# Patient Record
Sex: Male | Born: 1975 | Race: Black or African American | Hispanic: No | Marital: Single | Smoking: Former smoker
Health system: Southern US, Community
[De-identification: ages and names within clinical notes are randomized; demographics above are authoritative.]

## PROBLEM LIST (undated history)

## (undated) DIAGNOSIS — E119 Type 2 diabetes mellitus without complications: Secondary | ICD-10-CM

## (undated) DIAGNOSIS — B20 Human immunodeficiency virus [HIV] disease: Secondary | ICD-10-CM

## (undated) DIAGNOSIS — B977 Papillomavirus as the cause of diseases classified elsewhere: Secondary | ICD-10-CM

## (undated) DIAGNOSIS — Z21 Asymptomatic human immunodeficiency virus [HIV] infection status: Secondary | ICD-10-CM

## (undated) DIAGNOSIS — A539 Syphilis, unspecified: Secondary | ICD-10-CM

## (undated) DIAGNOSIS — E785 Hyperlipidemia, unspecified: Secondary | ICD-10-CM

## (undated) DIAGNOSIS — B181 Chronic viral hepatitis B without delta-agent: Secondary | ICD-10-CM

## (undated) DIAGNOSIS — B079 Viral wart, unspecified: Secondary | ICD-10-CM

## (undated) HISTORY — DX: Chronic viral hepatitis B without delta-agent: B18.1

## (undated) HISTORY — DX: Viral wart, unspecified: B07.9

## (undated) HISTORY — DX: Human immunodeficiency virus (HIV) disease: B20

## (undated) HISTORY — DX: Type 2 diabetes mellitus without complications: E11.9

## (undated) HISTORY — DX: Syphilis, unspecified: A53.9

## (undated) HISTORY — DX: Papillomavirus as the cause of diseases classified elsewhere: B97.7

## (undated) HISTORY — DX: Hyperlipidemia, unspecified: E78.5

---

## 2014-12-29 DIAGNOSIS — I639 Cerebral infarction, unspecified: Secondary | ICD-10-CM

## 2014-12-29 HISTORY — DX: Cerebral infarction, unspecified: I63.9

## 2021-05-20 ENCOUNTER — Other Ambulatory Visit: Payer: Self-pay

## 2021-05-20 ENCOUNTER — Ambulatory Visit: Payer: Self-pay

## 2021-05-20 ENCOUNTER — Other Ambulatory Visit: Payer: Medicare Other

## 2021-05-20 ENCOUNTER — Encounter: Payer: Self-pay | Admitting: Infectious Diseases

## 2021-05-20 DIAGNOSIS — B20 Human immunodeficiency virus [HIV] disease: Secondary | ICD-10-CM

## 2021-05-20 DIAGNOSIS — Z79899 Other long term (current) drug therapy: Secondary | ICD-10-CM

## 2021-05-20 DIAGNOSIS — Z113 Encounter for screening for infections with a predominantly sexual mode of transmission: Secondary | ICD-10-CM

## 2021-05-20 DIAGNOSIS — Z114 Encounter for screening for human immunodeficiency virus [HIV]: Secondary | ICD-10-CM

## 2021-05-20 NOTE — Addendum Note (Signed)
Addended by: Harley Alto on: 05/20/2021 09:28 AM   Modules accepted: Orders

## 2021-05-20 NOTE — Addendum Note (Signed)
Addended by: Harley Alto on: 05/20/2021 09:30 AM   Modules accepted: Orders

## 2021-05-21 ENCOUNTER — Telehealth: Payer: Self-pay

## 2021-05-21 LAB — URINE CYTOLOGY ANCILLARY ONLY
Chlamydia: NEGATIVE
Comment: NEGATIVE
Comment: NORMAL
Neisseria Gonorrhea: NEGATIVE

## 2021-05-21 LAB — URINALYSIS
Bilirubin Urine: NEGATIVE
Glucose, UA: NEGATIVE
Hgb urine dipstick: NEGATIVE
Ketones, ur: NEGATIVE
Nitrite: NEGATIVE
Protein, ur: NEGATIVE
Specific Gravity, Urine: 1.023 (ref 1.001–1.035)
pH: 5.5 (ref 5.0–8.0)

## 2021-05-21 LAB — T-HELPER CELL (CD4) - (RCID CLINIC ONLY)
CD4 % Helper T Cell: 9 % — ABNORMAL LOW (ref 33–65)
CD4 T Cell Abs: 152 /uL — ABNORMAL LOW (ref 400–1790)

## 2021-05-21 NOTE — Telephone Encounter (Signed)
-----   Message from Odette Fraction, MD sent at 05/21/2021  1:35 PM EDT ----- Please check with HD if patient was treated with syphilis before (date and treatment history). If not, will need to come for syphilis tx

## 2021-05-21 NOTE — Telephone Encounter (Signed)
Reasonable to wait for treatment records given patient says he was treated last year.

## 2021-05-21 NOTE — Telephone Encounter (Signed)
Yes

## 2021-05-21 NOTE — Telephone Encounter (Signed)
Call placed to [facility] Department. Health Department reports no previous history of know syphilis. Routing to MD for treatment orders.  Valarie Cones

## 2021-05-21 NOTE — Progress Notes (Signed)
Please check with HD if patient was treated with syphilis before (date and treatment history). If not, will need to come for syphilis tx

## 2021-05-21 NOTE — Telephone Encounter (Signed)
Patient patient he was tested and treated last year at his last ID office in [facility]. Patient gave verbal permission to contact office and provided office contact information for syphilis history and treatment history.   Contacted [facility] at [phone], spoke with staff, call was transferred to triage and had to leave a message regarding history and previous treatment.   [place], [place] [facility], [phone], they were not able to "break the glass" within patient's chart for review, but will get back with [facility] with records once available.   Routing to MD if we should wait for history or go ahead and treat at this time.   Valarie Cones

## 2021-05-23 NOTE — Telephone Encounter (Signed)
Faxed request for previous syphilis treatment and RPR titer history to [employer].   [facility]:  P: [phone] F: [phone]  Sandie Ano, RN

## 2021-05-28 NOTE — Telephone Encounter (Signed)
Called and left message for records follow up. Will continue to follow.

## 2021-05-29 NOTE — Telephone Encounter (Signed)
Called [facility], they state they received a records request from the health department, but not from our office. RN re-faxed request.   Request will be sent to their medical records office which can be reached at [phone] or [phone] for follow-up.  Sandie Ano, RN

## 2021-05-29 NOTE — Telephone Encounter (Signed)
Received records, placed in provider's box for review.  Sandie Ano, RN

## 2021-06-04 ENCOUNTER — Telehealth: Payer: Self-pay | Admitting: *Deleted

## 2021-06-04 ENCOUNTER — Ambulatory Visit: Payer: Self-pay | Admitting: Pharmacist

## 2021-06-04 ENCOUNTER — Encounter: Payer: Self-pay | Admitting: Infectious Diseases

## 2021-06-04 NOTE — Telephone Encounter (Addendum)
Called patient to see if he was on his way to today's visit; would offer virtual visit if he was available. Call went to voicemail. RN left message asking patient to reschedule today's missed visit. Let Neta Mends at the Health Department know that he didn't come to his appointment today. She will refer him to the bridge counselor at  Confirmed phone number, address. Andree Coss, RN

## 2021-06-06 LAB — LIPID PANEL
Cholesterol: 110 mg/dL (ref <200)
HDL: 44 mg/dL (ref >=40)
LDL Cholesterol (Calc): 53 mg/dL (calc)
Non-HDL Cholesterol (Calc): 66 mg/dL (calc) (ref <130)
Total CHOL/HDL Ratio: 2.5 (calc) (ref <5.0)
Triglycerides: 47 mg/dL (ref <150)

## 2021-06-06 LAB — CBC WITH DIFFERENTIAL/PLATELET
Absolute Monocytes: 499 cells/uL (ref 200–950)
Basophils Absolute: 122 cells/uL (ref 0–200)
Basophils Relative: 1.9 %
Eosinophils Absolute: 512 cells/uL — ABNORMAL HIGH (ref 15–500)
Eosinophils Relative: 8 %
HCT: 44.9 % (ref 38.5–50.0)
Hemoglobin: 15.4 g/dL (ref 13.2–17.1)
Lymphs Abs: 1773 cells/uL (ref 850–3900)
MCH: 32.4 pg (ref 27.0–33.0)
MCHC: 34.3 g/dL (ref 32.0–36.0)
MCV: 94.5 fL (ref 80.0–100.0)
MPV: 11.8 fL (ref 7.5–12.5)
Monocytes Relative: 7.8 %
Neutro Abs: 3494 cells/uL (ref 1500–7800)
Neutrophils Relative %: 54.6 %
Platelets: 168 10*3/uL (ref 140–400)
RBC: 4.75 10*6/uL (ref 4.20–5.80)
RDW: 13 % (ref 11.0–15.0)
Total Lymphocyte: 27.7 %
WBC: 6.4 10*3/uL (ref 3.8–10.8)

## 2021-06-06 LAB — HEPATITIS B CORE ANTIBODY, TOTAL: Hep B Core Total Ab: NONREACTIVE

## 2021-06-06 LAB — HIV-1 RNA ULTRAQUANT REFLEX TO GENTYP+
HIV 1 RNA Quant: NOT DETECTED copies/mL
HIV-1 RNA Quant, Log: NOT DETECTED Log copies/mL

## 2021-06-06 LAB — COMPLETE METABOLIC PANEL WITH GFR
AG Ratio: 1.7 (calc) (ref 1.0–2.5)
ALT: 35 U/L (ref 9–46)
AST: 28 U/L (ref 10–40)
Albumin: 4.1 g/dL (ref 3.6–5.1)
Alkaline phosphatase (APISO): 112 U/L (ref 36–130)
BUN: 21 mg/dL (ref 7–25)
CO2: 30 mmol/L (ref 20–32)
Calcium: 9.5 mg/dL (ref 8.6–10.3)
Chloride: 108 mmol/L (ref 98–110)
Creat: 1.32 mg/dL (ref 0.60–1.35)
GFR, Est African American: 76 mL/min/{1.73_m2} (ref >=60)
GFR, Est Non African American: 66 mL/min/{1.73_m2} (ref >=60)
Globulin: 2.4 g/dL (calc) (ref 1.9–3.7)
Glucose, Bld: 127 mg/dL — ABNORMAL HIGH (ref 65–99)
Potassium: 4.1 mmol/L (ref 3.5–5.3)
Sodium: 143 mmol/L (ref 135–146)
Total Bilirubin: 0.3 mg/dL (ref 0.2–1.2)
Total Protein: 6.5 g/dL (ref 6.1–8.1)

## 2021-06-06 LAB — HEPATITIS A ANTIBODY, TOTAL: Hepatitis A AB,Total: NONREACTIVE

## 2021-06-06 LAB — RPR TITER: RPR Titer: 1:16 {titer} — ABNORMAL HIGH

## 2021-06-06 LAB — QUANTIFERON-TB GOLD PLUS
Mitogen-NIL: >10 IU/mL
NIL: 0.05 IU/mL
QuantiFERON-TB Gold Plus: NEGATIVE
TB1-NIL: 0 IU/mL
TB2-NIL: <0 IU/mL

## 2021-06-06 LAB — HEPATITIS C ANTIBODY
Hepatitis C Ab: NONREACTIVE
SIGNAL TO CUT-OFF: 0.05 (ref <1.00)

## 2021-06-06 LAB — FLUORESCENT TREPONEMAL AB(FTA)-IGG-BLD: Fluorescent Treponemal ABS: REACTIVE — AB

## 2021-06-06 LAB — HEPATITIS B SURFACE ANTIGEN: Hepatitis B Surface Ag: REACTIVE — AB

## 2021-06-06 LAB — HLA B*5701: HLA-B*5701 w/rflx HLA-B High: NEGATIVE

## 2021-06-06 LAB — RPR: RPR Ser Ql: REACTIVE — AB

## 2021-06-06 LAB — HIV-1/2 AB - DIFFERENTIATION
HIV-1 antibody: POSITIVE — AB
HIV-2 Ab: NEGATIVE

## 2021-06-06 LAB — HIV ANTIBODY (ROUTINE TESTING W REFLEX): HIV 1&2 Ab, 4th Generation: REACTIVE — AB

## 2021-06-10 ENCOUNTER — Telehealth: Payer: Self-pay

## 2021-06-10 ENCOUNTER — Other Ambulatory Visit: Payer: Self-pay

## 2021-06-10 ENCOUNTER — Other Ambulatory Visit (HOSPITAL_COMMUNITY): Payer: Self-pay

## 2021-06-10 ENCOUNTER — Ambulatory Visit (INDEPENDENT_AMBULATORY_CARE_PROVIDER_SITE_OTHER): Payer: Medicare Other | Admitting: Pharmacist

## 2021-06-10 DIAGNOSIS — Z87438 Personal history of other diseases of male genital organs: Secondary | ICD-10-CM

## 2021-06-10 DIAGNOSIS — B181 Chronic viral hepatitis B without delta-agent: Secondary | ICD-10-CM

## 2021-06-10 DIAGNOSIS — E785 Hyperlipidemia, unspecified: Secondary | ICD-10-CM

## 2021-06-10 DIAGNOSIS — Z8673 Personal history of transient ischemic attack (TIA), and cerebral infarction without residual deficits: Secondary | ICD-10-CM

## 2021-06-10 DIAGNOSIS — E119 Type 2 diabetes mellitus without complications: Secondary | ICD-10-CM

## 2021-06-10 DIAGNOSIS — A630 Anogenital (venereal) warts: Secondary | ICD-10-CM

## 2021-06-10 DIAGNOSIS — B20 Human immunodeficiency virus [HIV] disease: Secondary | ICD-10-CM

## 2021-06-10 MED ORDER — METFORMIN HCL 500 MG PO TABS: 500.0000 mg | ORAL_TABLET | Freq: Every day | ORAL | 1 refills | Status: DC

## 2021-06-10 MED ORDER — ATORVASTATIN CALCIUM 40 MG PO TABS: 40.0000 mg | ORAL_TABLET | Freq: Every day | ORAL | 1 refills | Status: DC

## 2021-06-10 MED ORDER — TAMSULOSIN HCL 0.4 MG PO CAPS
0.4000 mg | ORAL_CAPSULE | Freq: Every evening | ORAL | 1 refills | Status: DC
Start: 2021-06-10 — End: 2022-06-24

## 2021-06-10 MED ORDER — ATOVAQUONE 750 MG/5ML PO SUSP: 1500.0000 mg | Freq: Every day | ORAL | 2 refills | Status: DC

## 2021-06-10 MED ORDER — IMIQUIMOD 5 % EX CREA: TOPICAL_CREAM | CUTANEOUS | 3 refills | Status: DC

## 2021-06-10 MED ORDER — ASPIRIN 81 MG PO TBEC: 81.0000 mg | DELAYED_RELEASE_TABLET | Freq: Every day | ORAL | 1 refills | Status: DC

## 2021-06-10 MED ORDER — BIKTARVY 50-200-25 MG PO TABS: 1.0000 | ORAL_TABLET | Freq: Every day | ORAL | 1 refills | Status: DC

## 2021-06-10 NOTE — Telephone Encounter (Signed)
[facility] Patient Advocate Encounter ? ?Insurance verification completed.   ? ?The patient is uninsured and will need patient assistance for medication. ? ?We can complete the application and will need to meet with the patient for signatures and income documentation. ? ?Kemaya Dorner, CPhT ?Specialty Pharmacy Patient Advocate ?[facility] ?Phone: [phone] ?Fax:  [phone]  ?

## 2021-06-10 NOTE — Progress Notes (Signed)
HPI: Jonathan Gutierrez is a 45 y.o. male who presents to the [facility] pharmacy clinic to establish care for his HIV infection.  There are no problems to display for this patient.   Patient's Medications   No medications on file    Allergies: Not on File  Past Medical History: No past medical history on file.  Social History: Social History   Socioeconomic History   Marital status: Single    Spouse name: Not on file   Number of children: Not on file   Years of education: Not on file   Highest education level: Not on file  Occupational History   Not on file  Tobacco Use   Smoking status: Not on file   Smokeless tobacco: Not on file  Substance and Sexual Activity   Alcohol use: Not on file   Drug use: Not on file   Sexual activity: Not on file  Other Topics Concern   Not on file  Social History Narrative   Not on file   Social Determinants of Health   Financial Resource Strain: Not on file  Food Insecurity: Not on file  Transportation Needs: Not on file  Physical Activity: Not on file  Stress: Not on file  Social Connections: Not on file    Labs: Lab Results  Component Value Date   HIV1RNAQUANT NOT DETECTED 05/20/2021   CD4TABS 152 (L) 05/20/2021    RPR and STI Lab Results  Component Value Date   LABRPR REACTIVE (A) 05/20/2021   RPRTITER 1:16 (H) 05/20/2021    STI Results GC CT  05/20/2021 Negative Negative    Hepatitis B Lab Results  Component Value Date   HEPBSAG REACTIVE (A) 05/20/2021   HEPBCAB NON-REACTIVE 05/20/2021   Hepatitis C Lab Results  Component Value Date   HEPCAB NON-REACTIVE 05/20/2021   Hepatitis A Lab Results  Component Value Date   HAV NON-REACTIVE 05/20/2021   Lipids: Lab Results  Component Value Date   CHOL 110 05/20/2021   TRIG 47 05/20/2021   HDL 44 05/20/2021   CHOLHDL 2.5 05/20/2021   LDLCALC 53 05/20/2021    Current HIV Regimen: Biktarvy  Assessment: Jonathan Gutierrez is here today to establish care in our clinic  after moving to [place]. He was previously seen at a clinic in [place], [place]. He moved to [place] after his mother passed away. He missed a visit with Dr. Elinor Parkinson on 6/7.   Dr. Elinor Parkinson has his records, so I do not have any information about him. He states that he is taking Biktarvy and does not miss any doses. He has about 2 weeks left of medication right now that he was able to get from his previous clinic. He is on several other medications and is running out of some. He had initial labs drawn here on 5/23 that showed an undetectable HIV viral load but a low CD4 count of 152. His Hepatitis B surface antigen was also positive, and the patient states that he does have chronic Hepatitis B. It is managed by the TAF component of his Biktarvy regimen.  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 He also states that he was hospitalized with a brain infection a few years ago in 2016 (?toxo). He also has diabetes, high cholesterol, and seasonal allergies.  Other medications that he is chronically on include vitamin D, atorvastatin, nicotine gum, metformin, aspirin, atovaquone, cetirizine, terbinafine cream, and tamsulosin. No OTC medications, except for the  occasional ibuprofen for mild pain and headaches.  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 He is on atovaquone, presumably for PJP prophylaxis and possibly secondary toxoplasmosis prophylaxis (?). Luckily, he has MyChart from his previous facility and I was able to get a complete medication list and look at his most recent lab work.   02/08/21: HIV viral load <20 CD4 168  02/13/21: Hepatitis B DNA 116  08/16/19: Syphilis titer 1:32 (I think; it is reported strangely)  His RPR here was 1:16 and patient states that he was treated for syphilis but it was back in 2016. Records were faxed to our office and placed with Dr. Elinor Parkinson, so I will let her follow up on  this. 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 He is trying to quit smoking and is currently chewing nicotine gum to help with this. He no longer smokes cigarettes and is just smoking black and mild right now; still trying to quit.  He has insurance, but I will give him a bottle of Biktarvy samples to last until he can get to [facility] to fill this. He states that he has not been sexually active since 2016. He is a Hospital doctor for [employer].   Will check a few additional Hepatitis B labs and have him see Dr. Elinor Parkinson when she is available in the next few weeks.   Plan: - Biktarvy samples - Send in maintenance medications to [facility] so he can get refills - Hepatitis B DNA, Hepatitis B e antigen and e antibody, Hepatitis B core antibody, AFB blood culture - F/u with Dr. Elinor Parkinson in 3 weeks  Yashica Sterbenz L. Areanna Gengler, PharmD, BCIDP, AAHIVP, CPP Clinical Pharmacist Practitioner Infectious Diseases Clinical Pharmacist [facility] 06/10/2021, 2:01 PM

## 2021-06-10 NOTE — Telephone Encounter (Signed)
Medication Samples have been provided to the patient.  Drug name: Biktarvy        Strength: 50/200/25 mg       Qty: 7   LOT: chysva   Exp.Date: 06/24  Dosing instructions: Take one tablet by mouth once daily

## 2021-06-20 ENCOUNTER — Other Ambulatory Visit (HOSPITAL_COMMUNITY): Payer: Self-pay

## 2021-06-20 ENCOUNTER — Telehealth: Payer: Self-pay

## 2021-06-20 NOTE — Telephone Encounter (Signed)
Initiated prior authorization for Atrovaquone via cover my meds Awaiting response from plan Valarie Cones

## 2021-06-20 NOTE — Telephone Encounter (Signed)
PA approved from 12/29/20-09/18/21. Pharmacy notified  via fax. Valarie Cones

## 2021-07-03 ENCOUNTER — Other Ambulatory Visit: Payer: Self-pay

## 2021-07-03 ENCOUNTER — Encounter: Payer: Self-pay | Admitting: Infectious Diseases

## 2021-07-03 ENCOUNTER — Ambulatory Visit (INDEPENDENT_AMBULATORY_CARE_PROVIDER_SITE_OTHER): Payer: Medicare Other | Admitting: Infectious Diseases

## 2021-07-03 ENCOUNTER — Telehealth: Payer: Self-pay

## 2021-07-03 VITALS — BP 111/71 | HR 67 | Temp 98.0°F | Wt 160.0 lb

## 2021-07-03 DIAGNOSIS — Z7185 Encounter for immunization safety counseling: Secondary | ICD-10-CM

## 2021-07-03 DIAGNOSIS — B181 Chronic viral hepatitis B without delta-agent: Secondary | ICD-10-CM | POA: Diagnosis not present

## 2021-07-03 DIAGNOSIS — B977 Papillomavirus as the cause of diseases classified elsewhere: Secondary | ICD-10-CM

## 2021-07-03 DIAGNOSIS — E785 Hyperlipidemia, unspecified: Secondary | ICD-10-CM

## 2021-07-03 DIAGNOSIS — E119 Type 2 diabetes mellitus without complications: Secondary | ICD-10-CM | POA: Insufficient documentation

## 2021-07-03 DIAGNOSIS — B20 Human immunodeficiency virus [HIV] disease: Secondary | ICD-10-CM | POA: Diagnosis present

## 2021-07-03 DIAGNOSIS — F17200 Nicotine dependence, unspecified, uncomplicated: Secondary | ICD-10-CM | POA: Diagnosis not present

## 2021-07-03 MED ORDER — ATOVAQUONE 750 MG/5ML PO SUSP: 1500.0000 mg | Freq: Every day | ORAL | 2 refills | Status: DC

## 2021-07-03 MED ORDER — BICTEGRAVIR-EMTRICITAB-TENOFOV 50-200-25 MG PO TABS: 1.0000 | ORAL_TABLET | Freq: Every day | ORAL | 3 refills | Status: DC

## 2021-07-03 NOTE — Progress Notes (Signed)
Jonathan Gutierrez, [place], [place], [address]                                                                  Phn. [phone]; Fax: [phone]                                                                             Date: 07/03/2021  Reason for Visit: HIV Initial Visit    HPI: Frutoso Dimare is a 45 y.o.old male with a history of HIV who is transferring his HIV care from [place]. He was diagnosed with HIV in [address] via a sexual encounter with a partner who was HIV positive.  He was in chair at that time in the [facility] [place] and was receiving HIV care in the clinic.  He started medication at the time of his diagnosis and remembers taking Atripla but believes that he may have developed resistance to that medication and then was started t on another medication which he does not recall. he says he has been sexually active with both male and male partners in the past. He remained in care for his HIV in [place] until moving to [place] in August 2019 where he spent 3 months until November 2019 and reports he had some medications for his HIV at that time but was not able to establish care due to problems with his insurance.  He denies being sexually active since 2016.  He was evaluated in June 2017 at [facility] in [place] where he had a head CT done which showed a possible subacute infarct in the right basal ganglia.  Abdomen pelvic CT demonstrated lymphadenopathy in, h/o hepatitis B low-dose, depression, history of self injury with cutting the repair at some point in the remote past , iron deficiency anemia  Currently trying to quit smoking, using nicotine gum , also has history of marijuana use but denies drinking alcohol patient.  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 His sister lives in [place] area. He lives with his aunt and currently works in [employer]. Denies having any pets at home.   He says he was taking Biktarvy after being seen  by Cassie on 06/10/21 but has not been taking it since this past Sunday as he had difficulty getting it from pharmacy. Denies any side effects with Biktarvy.   ROS: Denies dysphagia, odynophagia, cough, fever, nausea, vomiting, diarrhea, constipation, weight loss, chills, night sweats, recent hospitalizations, rashes, joint complaints, shortness of breath, headaches, chest pain, abdominal pain, dysuria .  HIV diagnosed: 2006, nadir 25/3% CD4 at diagnosis: unknown VL at diagnosis: unknown Recent CD4: 152 (9%) 05/20/21 Recent viral load: undetectable 05/20/21 Prior ART: Atripla, zidovudine, prezcobix/tivicay Current ART: Biktarvy  Hx of OI: H/o PCP in 2006, Hx of possible MAC in 2015 Hx of STI: h/o syphilis in 2016 s/p 3 injections of penicillin Risk factors: MSM   Genotype Hx: not available   Current Outpatient Medications on File Prior to Visit  Medication Sig Dispense Refill   aspirin (ASPIRIN 81) 81 MG EC tablet Take 1 tablet (81 mg total) by mouth daily. Swallow whole. 30 tablet 1   atorvastatin (LIPITOR) 40 MG tablet Take 1 tablet (40 mg total) by mouth daily. 30 tablet 1   bictegravir-emtricitabine-tenofovir AF (BIKTARVY) 50-200-25 MG TABS tablet Take 1 tablet by mouth daily. 30 tablet 1   cetirizine (ZYRTEC) 10 MG tablet Take 10 mg by mouth daily.     Cholecalciferol (DIALYVITE VITAMIN D 5000 PO) Take 5,000 Units by  mouth daily.     imiquimod (ALDARA) 5 % cream Apply topically 3 (three) times a week. 12 each 3   metFORMIN (GLUCOPHAGE) 500 MG tablet Take 1 tablet (500 mg total) by mouth daily with breakfast. 30 tablet 1   nicotine polacrilex (NICORETTE) 4 MG gum Take 4 mg by mouth as needed for smoking cessation.     tamsulosin (FLOMAX) 0.4 MG CAPS capsule Take 1 capsule (0.4 mg total) by mouth at bedtime. 30 capsule 1   terbinafine (LAMISIL) 1 % cream Apply 1 application topically 2 (two) times daily.     No current facility-administered medications on file prior to visit.     Social History   Socioeconomic History   Marital status: Single    Spouse name: Not on file   Number of children: Not on file   Years of education: Not on file   Highest education level: Not on file  Occupational History   Not on file  Tobacco Use   Smoking status: Every Day    Pack years: 0.00    Types: Cigarettes, Cigars   Smokeless tobacco: Never  Substance and Sexual Activity   Alcohol use: Not Currently   Drug use: Yes    Types: Marijuana   Sexual activity: Not Currently    Partners: Male, Male    Comment: declined condoms 07/03/21  Other Topics Concern   Not on file  Social History Narrative   Not on file   Social Determinants of Health   Financial Resource Strain: Not on file  Food Insecurity: Not on file  Transportation Needs: Not on file  Physical Activity: Not on file  Stress: Not on file  Social Connections: Not on file  Intimate Partner Violence: Not on file   Vitals  BP 111/71   Pulse 67   Temp 98 F (36.7 C) (Oral)   Wt 160 lb (72.6 kg)   Examination  Gen: Alert and oriented x 3, no acute distress HEENT: [place]/AT, no scleral icterus, no pale conjunctivae, hearing normal, oral mucosa moist Neck: Supple, no lymphadenopathy Cardio: Regular rate and rhythm; +S1 and S2; no murmurs, gallops, or rubs Resp: CTAB;  no wheezes, rhonchi, or rales GI: Soft, nontender, nondistended, bowel sounds  present GU: Musc: Extremities: No cyanosis, clubbing, or edema; +2 PT and DP pulses Skin: No rashes, lesions, or ecchymoses Neuro: No focal deficits, reported left sided weakness mainly in the LLE . Power of LLE is 4/5 and RLE is 5/5. Power of bilateral Upper extremity is 5/5  Psych: Calm, cooperative    Lab Results HIV 1 RNA Quant (copies/mL)  Date Value  05/20/2021 NOT DETECTED   CD4 T Cell Abs (/uL)  Date Value  05/20/2021 152 (L)   No results found for: HIV1GENOSEQ Lab Results  Component Value Date   WBC 6.4 05/20/2021   HGB 15.4 05/20/2021   HCT 44.9 05/20/2021   MCV 94.5 05/20/2021   PLT 168 05/20/2021    Lab Results  Component Value Date   CREATININE 1.32 05/20/2021   BUN 21 05/20/2021   NA 143 05/20/2021   K 4.1 05/20/2021   CL 108 05/20/2021   CO2 30 05/20/2021   Lab Results  Component Value Date   ALT 35 05/20/2021   AST 28 05/20/2021   BILITOT 0.3 05/20/2021    Lab Results  Component Value Date   CHOL 110 05/20/2021   TRIG 47 05/20/2021   HDL 44 05/20/2021   LDLCALC 53 05/20/2021   Lab Results  Component Value Date   HAV NON-REACTIVE 05/20/2021   Lab Results  Component Value Date   HEPBSAG REACTIVE (A) 05/20/2021   No results found for: HCVAB Lab Results  Component Value Date   CHLAMYDIAWP Negative 05/20/2021   N Negative 05/20/2021   No results found for: GCPROBEAPT No results found for: [place] Maintenance:  There is no immunization history on file for this patient.   Assessment/Plan: AIDs Discussed with patient treatment options and side effects, benefits of treatment, long term outcomes.  Discussed the severity of untreated HIV including higher cancer risk, opportunistic infections, renal failure.  Discussed needing to use condoms, partner disclosure, necessary vaccines, blood monitoring.     Continue BIKtarvy PO daily, sample given from clinic and prescriptions sent to his pharmacy. Staff contacted pharmacy to make  sure he gets his Biktarvy Atovaquone for PCP ppx given allergy with bactrim  Fu AFB blood cultures  Fu in 6-8 weeks  Chronic hepatitis B  Hep B surface ag and e ag reactive Hep B core ab IgM/core total and e antibody negative Hep B DNA 14 AST/ALT wnl  On Biktarvy  US abdomen with elastography is pending   ? H/o scrotal HPV  Using aldara cream   H/o Urinary retention  On Tamsulosin  H/o Cardiomyoapthy with use of life vest in the past/DM/Hx pf stroke/hyperlipidemia  Referral to PCP to establish care  On metformin, aspirin, atorvastatin   STD Screening  Recent Urine GC negative  08/16/2019 RPR titre 1:32, 05/20/21 RPR titre is 1:16, will trend titres   Smoking Counseled Using nicotine gum   Immunization Discussed about recommended vaccines in [place]   Patient's labs were reviewed as well as his previous records. Patients questions were addressed and answered. Safe sex counseling done.   I have personally spent 60 minutes involved in face-to-face and non-face-to-face activities for this patient on the day of the visit. Professional time spent includes the following activities: Preparing to see the patient (review of tests), Obtaining and/or reviewing separately obtained history (admission/discharge record), Performing a medically appropriate examination and/or evaluation , Ordering medications/tests/procedures, referring and communicating with other health care professionals, Documenting  clinical information in the EMR, Independently interpreting results (not separately reported), Communicating results to the patient/family/caregiver, Counseling and educating the patient/family/caregiver and Care coordination (not separately reported).    Electronically signed by:  Rosiland Oz, MD Infectious Disease Physician [facility] [address] [place], [place] [address] Phone: [phone]  Fax: [phone]

## 2021-07-03 NOTE — Telephone Encounter (Signed)
Medication Samples have been provided to the patient.  Drug name: Biktarvy        Strength: 50/200/25 mg       Qty: 7   LOT: [id number]   Exp.Date: 0624  Dosing instructions: Take one tablet by mouth once daily  Alleya Demeter, CPhT Specialty Pharmacy Patient Advocate [facility] Phone: [phone] Fax:  [phone]   

## 2021-07-19 ENCOUNTER — Other Ambulatory Visit: Payer: Self-pay

## 2021-07-19 DIAGNOSIS — Z8673 Personal history of transient ischemic attack (TIA), and cerebral infarction without residual deficits: Secondary | ICD-10-CM

## 2021-07-19 MED ORDER — CETIRIZINE HCL 10 MG PO TABS: 10.0000 mg | ORAL_TABLET | Freq: Every day | ORAL | 0 refills | Status: DC

## 2021-07-19 MED ORDER — ASPIRIN 81 MG PO TBEC: 81.0000 mg | DELAYED_RELEASE_TABLET | Freq: Every day | ORAL | 0 refills | Status: DC

## 2021-07-24 LAB — HEPATITIS B DNA, ULTRAQUANTITATIVE, PCR
Hepatitis B DNA (Calc): 1.16 {Log_IU}/mL — ABNORMAL HIGH
Hepatitis B DNA: 14 [IU]/mL — ABNORMAL HIGH

## 2021-07-24 LAB — HEPATITIS B E ANTIBODY: Hep B E Ab: NONREACTIVE

## 2021-07-24 LAB — AFB CULTURE, BLOOD
MICRO NUMBER:: 12004755
SPECIMEN QUALITY:: ADEQUATE

## 2021-07-24 LAB — HEPATITIS B E ANTIGEN: Hep B E Ag: REACTIVE — AB

## 2021-07-24 LAB — HEPATITIS B CORE ANTIBODY, IGM: Hep B C IgM: NONREACTIVE

## 2021-08-01 ENCOUNTER — Encounter: Payer: Self-pay | Admitting: Infectious Diseases

## 2021-08-19 ENCOUNTER — Ambulatory Visit: Payer: Medicare Other | Admitting: Infectious Diseases

## 2021-08-21 ENCOUNTER — Ambulatory Visit (INDEPENDENT_AMBULATORY_CARE_PROVIDER_SITE_OTHER): Payer: Self-pay | Admitting: Infectious Diseases

## 2021-08-21 ENCOUNTER — Other Ambulatory Visit: Payer: Self-pay

## 2021-08-21 ENCOUNTER — Encounter: Payer: Self-pay | Admitting: Infectious Diseases

## 2021-08-21 VITALS — BP 126/78 | HR 72 | Temp 97.8°F | Ht 70.0 in | Wt 158.0 lb

## 2021-08-21 DIAGNOSIS — B181 Chronic viral hepatitis B without delta-agent: Secondary | ICD-10-CM

## 2021-08-21 DIAGNOSIS — B20 Human immunodeficiency virus [HIV] disease: Secondary | ICD-10-CM

## 2021-08-21 DIAGNOSIS — E785 Hyperlipidemia, unspecified: Secondary | ICD-10-CM

## 2021-08-21 DIAGNOSIS — Z113 Encounter for screening for infections with a predominantly sexual mode of transmission: Secondary | ICD-10-CM

## 2021-08-21 MED ORDER — ATOVAQUONE 750 MG/5ML PO SUSP: 1500.0000 mg | Freq: Every day | ORAL | 5 refills | Status: DC

## 2021-08-21 MED ORDER — CETIRIZINE HCL 10 MG PO TABS: 10.0000 mg | ORAL_TABLET | Freq: Every day | ORAL | 0 refills | Status: DC

## 2021-08-21 MED ORDER — BIKTARVY 50-200-25 MG PO TABS: 1.0000 | ORAL_TABLET | Freq: Every day | ORAL | 5 refills | Status: DC

## 2021-08-21 NOTE — Progress Notes (Addendum)
[address], [place], [place], [address]                                                                  Phn. [phone]; Fax: [phone]                                                                             Date: 08/21/21  Reason for Visit: HIV Follow Up    HPI: Jonathan Gutierrez is a 45 y.o.old male with a history of HIV who is here for HIV follow up   08/21/21 Here for regular HIV follow up. Taking Biktarvy and atovaquone every day. Denies missing any doses or barriers to adherence of tx. He needs refills for his meds. He has not established care with his PCP and tells me he would like to get a phone number to call for the appointment. Megan RN provided contact # for PCP appointment. He tells me today that he had to use a life vest as part of law suit due to his insurance when in fact he did not have to use it. He is working with [employer] and says he cannot leave his job as the benefits of being unemployed is not good for him. Discussed about getting [place] of abdomen due to his h/o Hepatitis B to which he agreed.   Smokes 3 cigars a day, denies alcohol but uses marijuana. He lives with his room mate and tells me that he has not been sexually active since 2016. He tells me he has been vaccinated with 3 doses of COVID vaccine ( tells me his covid card in his car). He is willing to see a dentist for dental care. Appetite is good. Weight is 2lbs down from last clinic visit from 160lbs to 158 lbs. He tells me he has a high BMI.   ROS: Denies dysphagia, odynophagia, cough, fever, nausea, vomiting, diarrhea, constipation, weight loss, chills, night sweats, recent hospitalizations, rashes, joint complaints, shortness of breath, headaches, chest pain, abdominal pain, dysuria .  No past medical history on  file.  Current Outpatient Medications on File Prior to Visit  Medication Sig Dispense Refill   aspirin (ASPIRIN 81) 81 MG EC tablet Take 1 tablet (81 mg total) by mouth daily. Swallow whole. 30 tablet 0   atorvastatin (LIPITOR) 40 MG tablet Take 1 tablet (40 mg total) by mouth daily. 30 tablet 1   metFORMIN (GLUCOPHAGE) 500 MG tablet Take 1 tablet (500 mg total) by mouth daily with breakfast. 30 tablet 1   bictegravir-emtricitabine-tenofovir AF (BIKTARVY) 50-200-25 MG TABS tablet Take 1 tablet by mouth daily. 30 tablet 3  Cholecalciferol (DIALYVITE VITAMIN D 5000 PO) Take 5,000 Units by mouth daily. (Patient not taking: Reported on 08/21/2021)     tamsulosin (FLOMAX) 0.4 MG CAPS capsule Take 1 capsule (0.4 mg total) by mouth at bedtime. 30 capsule 1   No current facility-administered medications on file prior to visit.   Allergies  Allergen Reactions   Bactrim [Sulfamethoxazole-Trimethoprim]     Social History   Socioeconomic History   Marital status: Single    Spouse name: Not on file   Number of children: Not on file   Years of education: Not on file   Highest education level: Not on file  Occupational History   Not on file  Tobacco Use   Smoking status: Every Day    Types: Cigarettes, Cigars   Smokeless tobacco: Never  Substance and Sexual Activity   Alcohol use: Not Currently   Drug use: Yes    Types: Marijuana   Sexual activity: Not Currently    Partners: Male, Male    Comment: declined condoms 07/03/21  Other Topics Concern   Not on file  Social History Narrative   Not on file   Social Determinants of Health   Financial Resource Strain: Not on file  Food Insecurity: Not on file  Transportation Needs: Not on file  Physical Activity: Not on file  Stress: Not on file  Social Connections: Not on file  Intimate Partner Violence: Not on file   Vitals  BP 126/78   Pulse 72   Temp 97.8 F (36.6 C) (Oral)   Ht 5\' 10"  (1.778 m)   Wt 158 lb (71.7 kg)   SpO2 95%    BMI 22.67 kg/m   Examination  Gen: Alert and oriented x 3, no acute distress HEENT: [place]/AT, no scleral icterus, no pale conjunctivae, hearing normal, oral mucosa moist Neck: Supple, no lymphadenopathy Cardio: Regular rate and rhythm; +S1 and S2; no murmurs, gallops, or rubs Resp: CTAB; no wheezes, rhonchi, or rales GI: Soft, nontender, nondistended, bowel sounds present GU: Musc: Extremities: No cyanosis, clubbing, or edema; +2 PT and DP pulses Skin: No rashes, lesions, or ecchymoses Neuro: awake, alert and oriented, left sided weakness Psych: Calm, cooperative    Lab Results HIV 1 RNA Quant (copies/mL)  Date Value  05/20/2021 NOT DETECTED   CD4 T Cell Abs (/uL)  Date Value  05/20/2021 152 (L)   No results found for: HIV1GENOSEQ Lab Results  Component Value Date   WBC 6.4 05/20/2021   HGB 15.4 05/20/2021   HCT 44.9 05/20/2021   MCV 94.5 05/20/2021   PLT 168 05/20/2021    Lab Results  Component Value Date   CREATININE 1.32 05/20/2021   BUN 21 05/20/2021   NA 143 05/20/2021   K 4.1 05/20/2021   CL 108 05/20/2021   CO2 30 05/20/2021   Lab Results  Component Value Date   ALT 35 05/20/2021   AST 28 05/20/2021   BILITOT 0.3 05/20/2021    Lab Results  Component Value Date   CHOL 110 05/20/2021   TRIG 47 05/20/2021   HDL 44 05/20/2021   LDLCALC 53 05/20/2021   Lab Results  Component Value Date   HAV NON-REACTIVE 05/20/2021   Lab Results  Component Value Date   HEPBSAG REACTIVE (A) 05/20/2021   No results found for: HCVAB Lab Results  Component Value Date   CHLAMYDIAWP Negative 05/20/2021   N Negative 05/20/2021   No results found for: GCPROBEAPT No results found for: QUANTGOLD  Health Maintenance:  There is no immunization history on file for this patient.   Assessment/Plan: Problem List Items Addressed This Visit       Digestive   Chronic viral hepatitis B without delta agent and without coma (HCC)   Relevant Medications    bictegravir-emtricitabine-tenofovir AF (BIKTARVY) 50-200-25 MG TABS tablet   atovaquone (MEPRON) 750 MG/5ML suspension   Other Relevant Orders   US ABDOMEN COMPLETE W/ELASTOGRAPHY     Other   AIDS (acquired immune deficiency syndrome) (HCC) - Primary   Relevant Medications   bictegravir-emtricitabine-tenofovir AF (BIKTARVY) 50-200-25 MG TABS tablet   atovaquone (MEPRON) 750 MG/5ML suspension   Other Relevant Orders   HIV-1 RNA ultraquant reflex to gentyp+   T-helper cell (CD4)- ([facility] clinic only)   Ambulatory referral to Internal Medicine   Hyperlipidemia   Other Visit Diagnoses     Screening for venereal disease       Relevant Orders   RPR   Urine cytology ancillary only   HIV disease (HCC)       Relevant Medications   bictegravir-emtricitabine-tenofovir AF (BIKTARVY) 50-200-25 MG TABS tablet   atovaquone (MEPRON) 750 MG/5ML suspension      AIDs Continue Biktarvy PO daily, meds refilled  Atovaquone for PCP ppx given allergy with bactrim  Condoms offered Follow up in 5 months   Chronic hepatitis B  Hep B surface ag and e ag reactive Hep B core ab IgM/core total and e antibody negative Hep B DNA 14 AST/ALT wnl  On Biktarvy  US abdomen with elastography ordered  Will need to be vaccinated for Hep A  H/o Urinary retention  On Tamsulosin  H/o Cardiomyoapthy with use of life vest in the past/DM/Hx pf stroke/hyperlipidemia  Referral to PCP to establish care  On metformin, aspirin, atorvastatin   STD Screening  Urine GC and RPR 08/16/2019 RPR titre 1:32, 05/20/21 RPR titre is 1:16, RPR today  No active symptoms   Smoking Counseled  Immunization Discussed about recommended vaccines in [facility].  He says he has received 3 doses of COVID vaccines and will bring his covid card  Health Maintenance Dental Referral done   Patient's labs were reviewed as well as his previous records. Patients questions were addressed and answered. Safe sex counseling done.  I have  personally spent 30 minutes involved in face-to-face and non-face-to-face activities for this patient on the day of the visit.  Electronically signed by:  Odette Fraction, MD Infectious Disease Physician [facility] [address] [place], [place] [address] Phone: [phone]  Fax: [phone]

## 2021-08-21 NOTE — Addendum Note (Signed)
Addended by: Harley Alto on: 08/21/2021 12:10 PM   Modules accepted: Orders

## 2021-08-22 ENCOUNTER — Telehealth: Payer: Self-pay

## 2021-08-22 LAB — T-HELPER CELL (CD4) - (RCID CLINIC ONLY)
CD4 % Helper T Cell: 10 % — ABNORMAL LOW (ref 33–65)
CD4 T Cell Abs: 149 /uL — ABNORMAL LOW (ref 400–1790)

## 2021-08-22 NOTE — Telephone Encounter (Signed)
-----   Message from Odette Fraction, MD sent at 08/22/2021  2:19 PM EDT ----- Please let patient know that his RPR titre has stayed at the same level 1:16. He had told me he was last treated in 2016 for syphilis. 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

## 2021-08-22 NOTE — Telephone Encounter (Signed)
Patient notified of RPR titer results and come tomorrow for start of bicillin injections. 2.4 million units bicillin x3 weeks per verbal order from Dr. Elinor Parkinson. Will schedule remaining 2 doses tomorrow at appointment.  Brynja Marker Loyola Mast, RN

## 2021-08-23 ENCOUNTER — Other Ambulatory Visit: Payer: Self-pay

## 2021-08-23 ENCOUNTER — Ambulatory Visit (INDEPENDENT_AMBULATORY_CARE_PROVIDER_SITE_OTHER): Payer: Self-pay

## 2021-08-23 DIAGNOSIS — Z8673 Personal history of transient ischemic attack (TIA), and cerebral infarction without residual deficits: Secondary | ICD-10-CM

## 2021-08-23 DIAGNOSIS — A539 Syphilis, unspecified: Secondary | ICD-10-CM

## 2021-08-23 MED ORDER — CETIRIZINE HCL 10 MG PO TABS: 10.0000 mg | ORAL_TABLET | Freq: Every day | ORAL | 0 refills | Status: DC

## 2021-08-23 MED ORDER — ASPIRIN 81 MG PO TBEC: 81.0000 mg | DELAYED_RELEASE_TABLET | Freq: Every day | ORAL | 0 refills | Status: DC

## 2021-08-23 MED ORDER — PENICILLIN G BENZATHINE 1200000 UNIT/2ML IM SUSY
1.2000 10*6.[IU] | PREFILLED_SYRINGE | Freq: Once | INTRAMUSCULAR | Status: AC
  Administered 2021-08-23: 1.2 10*6.[IU] via INTRAMUSCULAR

## 2021-08-24 LAB — RPR: RPR Ser Ql: REACTIVE — AB

## 2021-08-24 LAB — RPR TITER: RPR Titer: 1:16 {titer} — ABNORMAL HIGH

## 2021-08-24 LAB — FLUORESCENT TREPONEMAL AB(FTA)-IGG-BLD: Fluorescent Treponemal ABS: REACTIVE — AB

## 2021-08-24 LAB — HIV-1 RNA ULTRAQUANT REFLEX TO GENTYP+
HIV 1 RNA Quant: <20 copies/mL — AB
HIV-1 RNA Quant, Log: <1.3 Log copies/mL — AB

## 2021-08-26 ENCOUNTER — Ambulatory Visit (HOSPITAL_COMMUNITY): Payer: Self-pay

## 2021-08-28 ENCOUNTER — Other Ambulatory Visit: Payer: Self-pay

## 2021-08-28 ENCOUNTER — Ambulatory Visit (INDEPENDENT_AMBULATORY_CARE_PROVIDER_SITE_OTHER): Payer: Self-pay

## 2021-08-28 DIAGNOSIS — A539 Syphilis, unspecified: Secondary | ICD-10-CM

## 2021-08-28 MED ORDER — PENICILLIN G BENZATHINE 1200000 UNIT/2ML IM SUSY
1.2000 10*6.[IU] | PREFILLED_SYRINGE | Freq: Once | INTRAMUSCULAR | Status: AC
  Administered 2021-08-28: 1.2 10*6.[IU] via INTRAMUSCULAR

## 2021-09-04 ENCOUNTER — Ambulatory Visit: Payer: Self-pay

## 2021-09-04 ENCOUNTER — Other Ambulatory Visit: Payer: Self-pay

## 2021-09-04 ENCOUNTER — Ambulatory Visit (HOSPITAL_COMMUNITY)
Admission: RE | Admit: 2021-09-04 | Discharge: 2021-09-04 | Disposition: A | Discharge status: Home / Self Care | Payer: Self-pay | Source: Ambulatory Visit | Attending: Infectious Diseases | Admitting: Infectious Diseases

## 2021-09-04 DIAGNOSIS — B181 Chronic viral hepatitis B without delta-agent: Secondary | ICD-10-CM | POA: Insufficient documentation

## 2021-09-26 ENCOUNTER — Telehealth: Payer: Self-pay

## 2021-09-26 NOTE — Telephone Encounter (Signed)
Patient started treatment for syphilis and was treated with first 2 doses of Bicillin on 08/23/21 and 08/28/21, but did not come in for final injection.   Have been unsuccessful in reaching the patient to restart treatment. Faxed referral to [facility] to restart treatment with Bicillin per Dr. Elinor Parkinson.   Sandie Ano, RN

## 2022-01-09 ENCOUNTER — Other Ambulatory Visit: Payer: Self-pay | Admitting: Pharmacist

## 2022-01-09 DIAGNOSIS — E785 Hyperlipidemia, unspecified: Secondary | ICD-10-CM

## 2022-01-09 DIAGNOSIS — E119 Type 2 diabetes mellitus without complications: Secondary | ICD-10-CM

## 2022-01-21 ENCOUNTER — Other Ambulatory Visit: Payer: Self-pay | Admitting: Infectious Diseases

## 2022-01-21 NOTE — Telephone Encounter (Signed)
Patient has an appointment on 01/22/22.

## 2022-01-22 ENCOUNTER — Ambulatory Visit (INDEPENDENT_AMBULATORY_CARE_PROVIDER_SITE_OTHER): Payer: 59 | Admitting: Infectious Diseases

## 2022-01-22 ENCOUNTER — Other Ambulatory Visit (HOSPITAL_COMMUNITY): Admission: RE | Admit: 2022-01-22 | Payer: 59 | Source: Ambulatory Visit | Admitting: Infectious Diseases

## 2022-01-22 ENCOUNTER — Other Ambulatory Visit: Payer: Self-pay

## 2022-01-22 VITALS — BP 121/87 | HR 95 | Resp 16 | Ht 70.0 in | Wt 158.0 lb

## 2022-01-22 DIAGNOSIS — Z113 Encounter for screening for infections with a predominantly sexual mode of transmission: Secondary | ICD-10-CM | POA: Diagnosis not present

## 2022-01-22 DIAGNOSIS — Z23 Encounter for immunization: Secondary | ICD-10-CM

## 2022-01-22 DIAGNOSIS — B181 Chronic viral hepatitis B without delta-agent: Secondary | ICD-10-CM

## 2022-01-22 DIAGNOSIS — Z5181 Encounter for therapeutic drug level monitoring: Secondary | ICD-10-CM

## 2022-01-22 DIAGNOSIS — B20 Human immunodeficiency virus [HIV] disease: Secondary | ICD-10-CM | POA: Diagnosis not present

## 2022-01-22 LAB — URINALYSIS
Bilirubin Urine: NEGATIVE
Hgb urine dipstick: NEGATIVE
Ketones, ur: NEGATIVE
Leukocytes,Ua: NEGATIVE
Nitrite: NEGATIVE
Protein, ur: NEGATIVE
Specific Gravity, Urine: 1.023 (ref 1.001–1.035)
pH: 6 (ref 5.0–8.0)

## 2022-01-22 MED ORDER — ATOVAQUONE 750 MG/5ML PO SUSP: 1500.0000 mg | Freq: Every day | ORAL | 11 refills | Status: DC

## 2022-01-22 MED ORDER — BIKTARVY 50-200-25 MG PO TABS: 1.0000 | ORAL_TABLET | Freq: Every day | ORAL | 3 refills | Status: DC

## 2022-01-22 MED ORDER — BIKTARVY 50-200-25 MG PO TABS: 1.0000 | ORAL_TABLET | Freq: Every day | ORAL | 11 refills | Status: DC

## 2022-01-22 NOTE — Progress Notes (Signed)
[place], [place], [place], [address]                                                                  Phn. [phone]; Fax: [phone]                                                                             Date: 01/22/22  Reason for Visit: Routine HIV care.   HPI: Jonathan Gutierrez is a 46 y.o.old male with a history of HIV.  Last seen on 08/21/21. Lab Results  Component Value Date   HIV1RNAQUANT <20 DETECTED (A) 08/21/2021   Lab Results  Component Value Date   CD4TABS 149 (L) 08/21/2021   CD4TABS 152 (L) 05/20/2021    Interim - Taking Bikytarvy and atovaquone. Has only missed one dose of Biktarvy day before yesterday. 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 He is planning to stay permanently in [place] from beginning of February. He is staying with his aunt in [place] currently. Denies sexual activity since 2016. Has stopped marijuana, cigar. Denies alcohol and illicit drugs. Was stressed when he moved to [place] but things are getting better. Denies being depressed/ideas of suicide or homicide. Appetite not good and thinks he might have lost around 8 lbs. Denies any recent illness or hospital admissions. He is planned to meet Finance tomorrow where he will discuss to see options where he can see a PCP. Willing to get Flu and Pneumonia vaccine today   Prophylaxis Organism Indicated Treatment  PCP Atovaquone  MAC no   ROS: As stated in above HPI; all other systems were reviewed and are otherwise negative unless noted below  No reported fever / chills, night sweats, acute visual change, odynophagia, chest pain/pressure, new or worsened SOB or WOB, nausea, vomiting, diarrhea,  dysuria, GU discharge, syncope, seizures, red/hot swollen joints, hallucinations / delusions, rashes, new allergies, unusual / excessive bleeding, swollen lymph nodes, or new hospitalizations/ED visits/Urgent Care visits since the pt was last seen.  PMH/ PSH/ FamHx / Social Hx , medications and allergies reviewed and updated as appropriate; please see corresponding tab in EHR / prior notes                                        Current Outpatient Medications on File Prior to Visit  Medication Sig Dispense Refill   aspirin (ASPIRIN 81) 81 MG EC tablet Take 1 tablet (81  mg total) by mouth daily. Swallow whole. 30 tablet 0   atorvastatin (LIPITOR) 40 MG tablet Take 1 tablet (40 mg total) by mouth daily. 30 tablet 1   bictegravir-emtricitabine-tenofovir AF (BIKTARVY) 50-200-25 MG TABS tablet Take 1 tablet by mouth daily. 30 tablet 3   cetirizine (ZYRTEC) 10 MG tablet Take 1 tablet (10 mg total) by mouth daily. 30 tablet 0   metFORMIN (GLUCOPHAGE) 500 MG tablet Take 1 tablet (500 mg total) by mouth daily with breakfast. 30 tablet 1   tamsulosin (FLOMAX) 0.4 MG CAPS capsule Take 1 capsule (0.4 mg total) by mouth at bedtime. 30 capsule 1   No current facility-administered medications on file prior to visit.     Allergies  Allergen Reactions   Bactrim [Sulfamethoxazole-Trimethoprim]    Social History   Socioeconomic History   Marital status: Single    Spouse name: Not on file   Number of children: Not on file   Years of education: Not on file   Highest education level: Not on file  Occupational History   Not on file  Tobacco Use   Smoking status: Every Day    Types: Cigarettes, Cigars   Smokeless tobacco: Never   Tobacco comments:    Trying to quit, smoking 3 black and milds a day   Substance and Sexual Activity   Alcohol use: Not Currently   Drug use: Yes    Types: Marijuana    Comment: daily   Sexual activity: Not Currently    Partners: Male, Male    Comment: declined condoms  07/2021  Other Topics Concern   Not on file  Social History Narrative   Not on file   Social Determinants of Health   Financial Resource Strain: Not on file  Food Insecurity: Not on file  Transportation Needs: Not on file  Physical Activity: Not on file  Stress: Not on file  Social Connections: Not on file  Intimate Partner Violence: Not on file   No family history on file.   Vitals  BP 121/87    Pulse 95    Resp 16    Ht _0  (1.778 m)    Wt 158 lb (71.7 kg)    SpO2 99%    BMI 22.67 kg/m    Examination  Gen: Alert and oriented x 3, no acute distress HEENT: [place]/AT, no scleral icterus, no pale conjunctivae, hearing normal, oral mucosa moist Neck: Supple Cardio: Regular rate and rhythm; +S1 and S2 Resp: CTAB GI: Soft, nontender, nondistended GU: Musc: Extremities: No cyanosis, clubbing, or edema Skin: No rashes, lesions, or ecchymoses Neuro: grossly non focal  Psych: Calm, cooperative  Lab Results HIV 1 RNA Quant (copies/mL)  Date Value  08/21/2021 <20 DETECTED (A)  05/20/2021 NOT DETECTED   CD4 T Cell Abs (/uL)  Date Value  08/21/2021 149 (L)  05/20/2021 152 (L)   No results found for: HIV1GENOSEQ Lab Results  Component Value Date   WBC 6.4 05/20/2021   HGB 15.4 05/20/2021   HCT 44.9 05/20/2021   MCV 94.5 05/20/2021   PLT 168 05/20/2021    Lab Results  Component Value Date   CREATININE 1.32 05/20/2021   BUN 21 05/20/2021   NA 143 05/20/2021   K 4.1 05/20/2021   CL 108 05/20/2021   CO2 30 05/20/2021   Lab Results  Component Value Date   ALT 35 05/20/2021   AST 28 05/20/2021   BILITOT 0.3 05/20/2021    Lab Results  Component Value Date  CHOL 110 05/20/2021   TRIG 47 05/20/2021   HDL 44 05/20/2021   LDLCALC 53 05/20/2021   Lab Results  Component Value Date   HAV NON-REACTIVE 05/20/2021   Lab Results  Component Value Date   HEPBSAG REACTIVE (A) 05/20/2021   No results found for: HCVAB Lab Results  Component Value Date    CHLAMYDIAWP Negative 05/20/2021   N Negative 05/20/2021   No results found for: GCPROBEAPT No results found for: [place] Maintenance:  There is no immunization history on file for this patient.  Problem List Items Addressed This Visit       Digestive   Chronic viral hepatitis B without delta agent and without coma (HCC)   Relevant Medications   bictegravir-emtricitabine-tenofovir AF (BIKTARVY) 50-200-25 MG TABS tablet   atovaquone (MEPRON) 750 MG/5ML suspension   Other Relevant Orders   Hepatitis B surface antigen   Hepatitis B e antigen   Hepatitis B e antibody   US Abdomen Complete     Other   AIDS (acquired immune deficiency syndrome) (HCC)   Relevant Medications   bictegravir-emtricitabine-tenofovir AF (BIKTARVY) 50-200-25 MG TABS tablet   atovaquone (MEPRON) 750 MG/5ML suspension   Need for immunization against influenza   Relevant Orders   Flu Vaccine QUAD 58moIM (Fluarix, Fluzone & Alfiuria Quad PF) (Completed)   Need for pneumococcal vaccine   Relevant Orders   Pneumococcal conjugate vaccine 20-valent (Prevnar-20) (Completed)   Other Visit Diagnoses     HIV disease ([place]    -  Primary   Relevant Medications   bictegravir-emtricitabine-tenofovir AF (BIKTARVY) 50-200-25 MG TABS tablet   atovaquone (MEPRON) 750 MG/5ML suspension   Other Relevant Orders   HIV-1 RNA ultraquant reflex to gentyp+   T-helper cell (CD4)- ([facility] clinic only)   AMB REFERRAL TO COMMUNITY SERVICE AGENCY   Screening for venereal disease       Relevant Orders   RPR   Urine cytology ancillary only   Medication monitoring encounter       Relevant Orders   Comprehensive metabolic panel   Urinalysis       Assessment/Plan: HIV/AIDs Continue Biktarvy and Atovaquone Meds refilled Meet with Finance tomorrow Referred to [facility] in 5-6 months if labs from today unremarkable     Chronic Hepatitis B On Biktarvy CMP, HBV DNA, hep B surface antigen, e antigen and e  antibody [place]  Smoking Congratulated on quitting   STD Screening  No acute concerns  Urine GC and RPR  #Immunization  COVID - says has been vaccinated including booster Influenza - today  Monkeypox - due Pneumococcal - PCV 20 today Meningitis HepA - due  Tdap Shingles  Patient's labs were reviewed as well as his previous records. Patients questions were addressed and answered. Safe sex counseling done.  I have personally spent 42 minutes involved in face-to-face and non-face-to-face activities for this patient on the day of the visit, coordination of care, staff time and counseling of the patient.  Electronically signed by:  SRosiland Oz MD Infectious Disease Physician [facility] [address] [place] [address]Phone: [phone]  Fax: [phone]

## 2022-01-22 NOTE — Addendum Note (Signed)
Addended by: Odette Fraction on: 01/22/2022 06:25 PM   Modules accepted: Orders

## 2022-01-23 ENCOUNTER — Ambulatory Visit: Payer: Self-pay

## 2022-01-23 ENCOUNTER — Other Ambulatory Visit: Payer: Self-pay

## 2022-01-23 LAB — T-HELPER CELL (CD4) - (RCID CLINIC ONLY)
CD4 % Helper T Cell: 10 % — ABNORMAL LOW (ref 33–65)
CD4 T Cell Abs: 207 /uL — ABNORMAL LOW (ref 400–1790)

## 2022-01-24 LAB — URINE CYTOLOGY ANCILLARY ONLY
Chlamydia: NEGATIVE
Comment: NEGATIVE
Comment: NORMAL
Neisseria Gonorrhea: NEGATIVE

## 2022-01-26 LAB — COMPREHENSIVE METABOLIC PANEL
AG Ratio: 1.6 (calc) (ref 1.0–2.5)
ALT: 30 U/L (ref 9–46)
AST: 23 U/L (ref 10–40)
Albumin: 4.5 g/dL (ref 3.6–5.1)
Alkaline phosphatase (APISO): 96 U/L (ref 36–130)
BUN: 15 mg/dL (ref 7–25)
CO2: 28 mmol/L (ref 20–32)
Calcium: 9.6 mg/dL (ref 8.6–10.3)
Chloride: 107 mmol/L (ref 98–110)
Creat: 1.29 mg/dL (ref 0.60–1.29)
Globulin: 2.9 g/dL (calc) (ref 1.9–3.7)
Glucose, Bld: 117 mg/dL — ABNORMAL HIGH (ref 65–99)
Potassium: 3.8 mmol/L (ref 3.5–5.3)
Sodium: 141 mmol/L (ref 135–146)
Total Bilirubin: 0.5 mg/dL (ref 0.2–1.2)
Total Protein: 7.4 g/dL (ref 6.1–8.1)

## 2022-01-26 LAB — RPR: RPR Ser Ql: REACTIVE — AB

## 2022-01-26 LAB — HEPATITIS B E ANTIBODY: Hep B E Ab: NONREACTIVE

## 2022-01-26 LAB — HEPATITIS B SURFACE ANTIGEN: Hepatitis B Surface Ag: REACTIVE — AB

## 2022-01-26 LAB — RPR TITER: RPR Titer: 1:16 {titer} — ABNORMAL HIGH

## 2022-01-26 LAB — FLUORESCENT TREPONEMAL AB(FTA)-IGG-BLD: Fluorescent Treponemal ABS: REACTIVE — AB

## 2022-01-26 LAB — HIV-1 RNA ULTRAQUANT REFLEX TO GENTYP+
HIV 1 RNA Quant: 55 copies/mL — ABNORMAL HIGH
HIV-1 RNA Quant, Log: 1.74 Log copies/mL — ABNORMAL HIGH

## 2022-01-26 LAB — HEPATITIS B E ANTIGEN: Hep B E Ag: REACTIVE — AB

## 2022-01-27 ENCOUNTER — Ambulatory Visit (HOSPITAL_COMMUNITY)
Admission: RE | Admit: 2022-01-27 | Discharge: 2022-01-27 | Disposition: A | Discharge status: Home / Self Care | Payer: 59 | Source: Ambulatory Visit | Attending: Infectious Diseases | Admitting: Infectious Diseases

## 2022-01-27 ENCOUNTER — Telehealth: Payer: Self-pay

## 2022-01-27 ENCOUNTER — Other Ambulatory Visit: Payer: Self-pay

## 2022-01-27 DIAGNOSIS — B181 Chronic viral hepatitis B without delta-agent: Secondary | ICD-10-CM | POA: Diagnosis present

## 2022-01-27 NOTE — Telephone Encounter (Signed)
-----   Message from Odette Fraction, MD sent at 01/27/2022  7:24 AM EST ----- Please make a fu visit with me this week to discuss lab results ( regarding his RPR titre which continues to be elevated at 1:16).

## 2022-01-27 NOTE — Telephone Encounter (Signed)
Patient scheduled on 2/2 at 11:30 PM.    Marcell Anger, CMA

## 2022-01-30 ENCOUNTER — Ambulatory Visit (INDEPENDENT_AMBULATORY_CARE_PROVIDER_SITE_OTHER): Payer: Self-pay | Admitting: Infectious Diseases

## 2022-01-30 ENCOUNTER — Encounter: Payer: Self-pay | Admitting: Infectious Diseases

## 2022-01-30 ENCOUNTER — Other Ambulatory Visit: Payer: Self-pay

## 2022-01-30 VITALS — BP 132/83 | HR 80 | Temp 97.8°F | Wt 156.0 lb

## 2022-01-30 DIAGNOSIS — B181 Chronic viral hepatitis B without delta-agent: Secondary | ICD-10-CM

## 2022-01-30 DIAGNOSIS — B079 Viral wart, unspecified: Secondary | ICD-10-CM | POA: Insufficient documentation

## 2022-01-30 DIAGNOSIS — B20 Human immunodeficiency virus [HIV] disease: Secondary | ICD-10-CM | POA: Insufficient documentation

## 2022-01-30 DIAGNOSIS — A539 Syphilis, unspecified: Secondary | ICD-10-CM

## 2022-01-30 MED ORDER — PENICILLIN G BENZATHINE 1200000 UNIT/2ML IM SUSY
1.2000 10*6.[IU] | PREFILLED_SYRINGE | Freq: Once | INTRAMUSCULAR | Status: AC
  Administered 2022-01-30: 1.2 10*6.[IU] via INTRAMUSCULAR

## 2022-01-30 MED ORDER — IMIQUIMOD 5 % EX CREA: TOPICAL_CREAM | CUTANEOUS | 5 refills | Status: DC

## 2022-01-30 NOTE — Progress Notes (Addendum)
[address], [place], [place], [address]                                                                  Phn. [phone]; Fax: [phone]                                                                             Date: 01/30/22  Reason for Visit: abnormal lab    HPI: Jonathan Gutierrez is a 46 y.o.old male with a history of HIV.  Last seen on 01/22/22. Called back given persistent RPR titre 1:16 since 04/2021. He last got 2 weekly shots of Benzathine Penicillin on 07/2021 but missed his 3rd dose. He denies being sexually active since 2015. He had previously reported being treated for syphilis in 2016 while in [place].   Denies any headache, blurry vision, neck pain, hearing changes or neurological complaints  He also has a wart in his penile area which he thinks started oin 2021, and seems to be slowly growing in size. It is painful at times. He has not been able to use the aldara cream that was previously prescribed by pharmacy. He would like to see a Dermatologist for it.    ROS: As stated in above HPI; all other systems were reviewed and are otherwise negative unless noted below  No reported fever / chills, night sweats, unintentional weight loss, acute visual change, odynophagia, chest pain/pressure, new or worsened SOB or WOB, nausea, vomiting, diarrhea, dysuria, GU discharge, syncope, seizures, red/hot swollen joints, hallucinations / delusions, rashes, new allergies, unusual / excessive bleeding, swollen lymph nodes, or new hospitalizations/ED visits/Urgent Care visits since the pt was last seen.  PMH/ PSH/ FamHx / Social Hx , medications and allergies reviewed and updated as appropriate; please see corresponding tab in EHR / prior notes                                        Current Outpatient  Medications on File Prior to Visit  Medication Sig Dispense Refill   aspirin (ASPIRIN 81) 81 MG EC tablet Take 1 tablet (81 mg total) by mouth daily. Swallow whole. 30 tablet 0   atorvastatin (LIPITOR) 40 MG tablet Take 1 tablet (40 mg total) by mouth daily. 30 tablet 1   atovaquone (MEPRON) 750 MG/5ML suspension Take 10 mLs (1,500 mg total) by mouth daily with breakfast. 210 mL 11   bictegravir-emtricitabine-tenofovir AF (BIKTARVY) 50-200-25 MG TABS tablet Take 1 tablet by mouth daily. 30 tablet 3   bictegravir-emtricitabine-tenofovir AF (BIKTARVY) 50-200-25 MG  TABS tablet Take 1 tablet by mouth daily. 90 tablet 3   cetirizine (ZYRTEC) 10 MG tablet Take 1 tablet (10 mg total) by mouth daily. 30 tablet 0   metFORMIN (GLUCOPHAGE) 500 MG tablet Take 1 tablet (500 mg total) by mouth daily with breakfast. 30 tablet 1   tamsulosin (FLOMAX) 0.4 MG CAPS capsule Take 1 capsule (0.4 mg total) by mouth at bedtime. 30 capsule 1   No current facility-administered medications on file prior to visit.    Allergies  Allergen Reactions   Bactrim [Sulfamethoxazole-Trimethoprim]    Social History   Socioeconomic History   Marital status: Single    Spouse name: Not on file   Number of children: Not on file   Years of education: Not on file   Highest education level: Not on file  Occupational History   Not on file  Tobacco Use   Smoking status: Every Day    Types: Cigarettes, Cigars   Smokeless tobacco: Never   Tobacco comments:    Trying to quit, smoking 3 black and milds a day   Substance and Sexual Activity   Alcohol use: Not Currently   Drug use: Yes    Types: Marijuana    Comment: daily   Sexual activity: Not Currently    Partners: Male, Male    Comment: declined condoms 07/2021  Other Topics Concern   Not on file  Social History Narrative   Not on file   Social Determinants of Health   Financial Resource Strain: Not on file  Food Insecurity: Not on file  Transportation Needs: Not  on file  Physical Activity: Not on file  Stress: Not on file  Social Connections: Not on file  Intimate Partner Violence: Not on file   No family history on file.  Vitals  BP 132/83    Pulse 80    Temp 97.8 F (36.6 C) (Temporal)    Wt 156 lb (70.8 kg)    SpO2 98%    BMI 22.38 kg/m   Examination  Gen: Alert and ( chaperoned by staff) Awake, alert and oriented x 3, no acute distress HEENT: [place]/AT, no scleral icterus, no pale conjunctivae, hearing normal, oral mucosa moist Neck: Supple Cardio: Regular rate and rhythm; +S1 and S2 Resp: CTAB GI: nondistended GU:   Musc: Extremities: No cyanosis, clubbing, or edema Skin: penile wart  Neuro: grossly non focal  Psych: Calm, cooperative  Lab Results HIV 1 RNA Quant (copies/mL)  Date Value  01/22/2022 55 (H)  08/21/2021 <20 DETECTED (A)  05/20/2021 NOT DETECTED   CD4 T Cell Abs (/uL)  Date Value  01/22/2022 207 (L)  08/21/2021 149 (L)  05/20/2021 152 (L)   No results found for: HIV1GENOSEQ Lab Results  Component Value Date   WBC 6.4 05/20/2021   HGB 15.4 05/20/2021   HCT 44.9 05/20/2021   MCV 94.5 05/20/2021   PLT 168 05/20/2021    Lab Results  Component Value Date   CREATININE 1.29 01/22/2022   BUN 15 01/22/2022   NA 141 01/22/2022   K 3.8 01/22/2022   CL 107 01/22/2022   CO2 28 01/22/2022   Lab Results  Component Value Date   ALT 30 01/22/2022   AST 23 01/22/2022   BILITOT 0.5 01/22/2022    Lab Results  Component Value Date   CHOL 110 05/20/2021   TRIG 47 05/20/2021   HDL 44 05/20/2021   LDLCALC 53 05/20/2021   Lab Results  Component Value Date   HAV  NON-REACTIVE 05/20/2021   Lab Results  Component Value Date   HEPBSAG REACTIVE (A) 01/22/2022   No results found for: HCVAB Lab Results  Component Value Date   CHLAMYDIAWP Negative 01/22/2022   N Negative 01/22/2022   No results found for: GCPROBEAPT No results found for: QUANTGOLD   Health Maintenance: Immunization History   Administered Date(s) Administered   Influenza,inj,Quad PF,6+ Mos 01/22/2022   PNEUMOCOCCAL CONJUGATE-20 01/22/2022   Problem List Items Addressed This Visit   None Visit Diagnoses     HIV disease (HCC)    -  Primary   Relevant Medications   imiquimod (ALDARA) 5 % cream (Start on 01/31/2022)   Syphilis       Relevant Medications   penicillin g benzathine (BICILLIN LA) 1200000 UNIT/2ML injection 1.2 Million Units (Completed)   penicillin g benzathine (BICILLIN LA) 1200000 UNIT/2ML injection 1.2 Million Units (Completed)   imiquimod (ALDARA) 5 % cream (Start on 01/31/2022)   Viral warts, unspecified type       Relevant Medications   imiquimod (ALDARA) 5 % cream (Start on 01/31/2022)   Other Relevant Orders   Ambulatory referral to Dermatology       Assessment/Plan: # Syphilis - Restart 3 weekly shots of Benzathine penicillin 2.4 million units, once a week since patient did not complete Penicillin series in August 2022 - No concerns for Neurosyphilis   # Penile wart  - Imiquimod cream 5%, apply as instructed for 16 weeks  - Referral to Dermatology   # HIV - Continue Biktarvy and atovaquone as is - Fu in 5-6 months   # Chronic Hepatitis B  - Continue Biktarvy  - 1/25 ALT/AST WNL, Hep B surface ag and e antigen positive - US abdomen 1/30 hepatic steatosis - Fu in 5-6 months  #  H/o Cardiomyoapthy with use of life vest in the past/DM/Hx pf stroke/hyperlipidemia  - On aspirin/atorvastatin and metformin - Follow up with PCP  - Not using life vest anymore   #Immunization  COVID - says has been vaccinated including booster Influenza - 01/22/22 Monkeypox - due Pneumococcal - PCV 20 01/22/22 Meningitis HepA - due  Tdap Shingles  Patient's labs were reviewed as well as his previous records. Patients questions were addressed and answered. Safe sex counseling done.  I have personally spent 40 minutes involved in face-to-face and non-face-to-face activities for this patient on  the day of the visit.   Electronically signed by:  Odette FractionSabina Kiam Bransfield, MD Infectious Disease Physician [facility]   [facility] [address] [place], [place] [address] Phone: [phone]   Fax: [phone]

## 2022-02-06 ENCOUNTER — Other Ambulatory Visit: Payer: Self-pay

## 2022-02-06 ENCOUNTER — Telehealth: Payer: Self-pay | Admitting: Dermatology

## 2022-02-06 ENCOUNTER — Ambulatory Visit (INDEPENDENT_AMBULATORY_CARE_PROVIDER_SITE_OTHER): Payer: No Typology Code available for payment source

## 2022-02-06 DIAGNOSIS — A539 Syphilis, unspecified: Secondary | ICD-10-CM | POA: Diagnosis not present

## 2022-02-06 MED ORDER — PENICILLIN G BENZATHINE 1200000 UNIT/2ML IM SUSY
1.2000 10*6.[IU] | PREFILLED_SYRINGE | Freq: Once | INTRAMUSCULAR | Status: AC
  Administered 2022-02-06: 1.2 10*6.[IU] via INTRAMUSCULAR

## 2022-02-06 NOTE — Telephone Encounter (Signed)
Patient is calling for a referral appointment from Rosiland Oz, M.D.  Patient does not want to wait until September 2023 for an appointment so would like referral sent back to Dr. Levonne Spiller office.

## 2022-02-10 NOTE — Telephone Encounter (Signed)
Notes documented and referral routed back to referring office. 

## 2022-02-13 ENCOUNTER — Other Ambulatory Visit: Payer: Self-pay

## 2022-02-13 ENCOUNTER — Ambulatory Visit (INDEPENDENT_AMBULATORY_CARE_PROVIDER_SITE_OTHER): Payer: No Typology Code available for payment source

## 2022-02-13 DIAGNOSIS — A539 Syphilis, unspecified: Secondary | ICD-10-CM | POA: Diagnosis not present

## 2022-02-13 MED ORDER — PENICILLIN G BENZATHINE 1200000 UNIT/2ML IM SUSY
1.2000 10*6.[IU] | PREFILLED_SYRINGE | Freq: Once | INTRAMUSCULAR | Status: AC
  Administered 2022-02-13: 1.2 10*6.[IU] via INTRAMUSCULAR

## 2022-02-17 NOTE — Progress Notes (Signed)
S/p 2 doses of IM B Penicillin 2.4 million units on 2/9 and 2/16.

## 2022-04-25 ENCOUNTER — Encounter (HOSPITAL_COMMUNITY): Payer: Self-pay

## 2022-04-25 ENCOUNTER — Ambulatory Visit (HOSPITAL_COMMUNITY)
Admission: EM | Admit: 2022-04-25 | Discharge: 2022-04-25 | Disposition: A | Discharge status: Home / Self Care | Payer: No Typology Code available for payment source | Attending: Family Medicine | Admitting: Family Medicine

## 2022-04-25 DIAGNOSIS — R059 Cough, unspecified: Secondary | ICD-10-CM | POA: Diagnosis present

## 2022-04-25 DIAGNOSIS — Z79899 Other long term (current) drug therapy: Secondary | ICD-10-CM | POA: Diagnosis not present

## 2022-04-25 DIAGNOSIS — Z8616 Personal history of COVID-19: Secondary | ICD-10-CM | POA: Insufficient documentation

## 2022-04-25 DIAGNOSIS — B20 Human immunodeficiency virus [HIV] disease: Secondary | ICD-10-CM | POA: Diagnosis not present

## 2022-04-25 DIAGNOSIS — Z20822 Contact with and (suspected) exposure to covid-19: Secondary | ICD-10-CM | POA: Insufficient documentation

## 2022-04-25 DIAGNOSIS — J069 Acute upper respiratory infection, unspecified: Secondary | ICD-10-CM | POA: Diagnosis not present

## 2022-04-25 DIAGNOSIS — Z8673 Personal history of transient ischemic attack (TIA), and cerebral infarction without residual deficits: Secondary | ICD-10-CM

## 2022-04-25 LAB — SARS CORONAVIRUS 2 (TAT 6-24 HRS): SARS Coronavirus 2: NEGATIVE

## 2022-04-25 NOTE — Discharge Instructions (Addendum)
?  You have been swabbed for COVID, and the test will result in the next 24 hours. Our staff will call you if positive. If the test is positive, you should quarantine for 5 days.  ? ?You can take robitussin DM for the cough ? ? ?

## 2022-04-25 NOTE — ED Triage Notes (Signed)
Pt presents with non productive cough since last night. ?

## 2022-04-25 NOTE — ED Provider Notes (Addendum)
?[facility] ? ? ? ?CSN: [id number] ?Arrival date & time: 04/25/22  1049 ? ? ?  ? ?History   ?Chief Complaint ?Chief Complaint  ?Patient presents with  ? Cough  ? ? ?HPI ?Jonathan Gutierrez is a 46 y.o. male.  ? ? ?Cough ?Here for dry cough that began yesterday evening.  He feels a little irritated in his throat.  No fever or chills.  No vomiting or diarrhea. ? ?He did have COVID and tested + January 8 of this year.  He was able to recover without any interventions within a week or so.  He does take Biktarvy for his HIV.  He did not take an oral antiviral with that illness in January ? ?He was exposed to a family member that had upper respiratory infection symptoms, but their rapid COVID test was negative ? ?History reviewed. No pertinent past medical history. ? ?Patient Active Problem List  ? Diagnosis Date Noted  ? Viral warts 01/30/2022  ? Syphilis 01/30/2022  ? HIV disease (HCC) 01/30/2022  ? Need for immunization against influenza 01/22/2022  ? Need for pneumococcal vaccine 01/22/2022  ? AIDS (acquired immune deficiency syndrome) (HCC) 07/03/2021  ? Type 2 diabetes mellitus without complication, without long-term current use of insulin (HCC) 07/03/2021  ? Hyperlipidemia 07/03/2021  ? Chronic viral hepatitis B without delta agent and without coma (HCC) 07/03/2021  ? Smoking 07/03/2021  ? Immunization counseling 07/03/2021  ? 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 ? ?History reviewed. No pertinent surgical history. ? ? ? ? ?Home Medications   ? ?Prior to Admission medications   ?Medication Sig Start Date End Date Taking? Authorizing Provider  ?aspirin (ASPIRIN 81) 81 MG EC tablet Take 1 tablet (81 mg total) by mouth daily. Swallow whole. 08/23/21   Odette Fraction, MD  ?atorvastatin (LIPITOR) 40 MG tablet Take 1 tablet (40 mg total) by mouth daily. 06/10/21   Kuppelweiser, Cassie L, RPH-CPP  ?atovaquone (MEPRON) 750 MG/5ML suspension Take 10 mLs (1,500 mg total) by mouth daily with breakfast.  01/22/22   Odette Fraction, MD  ?bictegravir-emtricitabine-tenofovir AF (BIKTARVY) 50-200-25 MG TABS tablet Take 1 tablet by mouth daily. 07/03/21   Odette Fraction, MD  ?bictegravir-emtricitabine-tenofovir AF (BIKTARVY) 50-200-25 MG TABS tablet Take 1 tablet by mouth daily. 01/22/22   Odette Fraction, MD  ?cetirizine (ZYRTEC) 10 MG tablet Take 1 tablet (10 mg total) by mouth daily. 08/23/21   Odette Fraction, MD  ?imiquimod Mathis Dad) 5 % cream Apply topically 3 (three) times a week. Apply once daily at bedtime, 3 times/week on non consecutive days for up to 16 weeks.  ?Treatment area should be washed with soap and water 6-10 hrs after application 01/31/22   Odette Fraction, MD  ?metFORMIN (GLUCOPHAGE) 500 MG tablet Take 1 tablet (500 mg total) by mouth daily with breakfast. 06/10/21   Kuppelweiser, Cassie L, RPH-CPP  ?tamsulosin (FLOMAX) 0.4 MG CAPS capsule Take 1 capsule (0.4 mg total) by mouth at bedtime. 06/10/21   Kuppelweiser, Cassie L, RPH-CPP  ? ? ?Family History ?Family History  ?Family history unknown: Yes  ? ? ?Social History ?Social History  ? ?Tobacco Use  ? Smoking status: Every Day  ?  Types: Cigarettes, Cigars  ? Smokeless tobacco: Never  ? Tobacco comments:  ?  Trying to quit, smoking 3 black and milds a day   ?Substance Use Topics  ? Alcohol use: Not Currently  ? Drug use: Yes  ?  Types: Marijuana  ?  Comment: daily  ? ? ? ?  Allergies   ?Bactrim [sulfamethoxazole-trimethoprim] ? ? ?Review of Systems ?Review of Systems  ?Respiratory:  Positive for cough.   ? ? ?Physical Exam ?Triage Vital Signs ?ED Triage Vitals  ?Enc Vitals Group  ?   BP 04/25/22 1154 (!) 145/90  ?   Pulse Rate 04/25/22 1154 68  ?   Resp 04/25/22 1154 17  ?   Temp 04/25/22 1154 98.1 ?F (36.7 ?C)  ?   Temp Source 04/25/22 1154 Oral  ?   SpO2 04/25/22 1154 96 %  ?   Weight --   ?   Height --   ?   Head Circumference --   ?   Peak Flow --   ?   Pain Score 04/25/22 1155 1  ?   Pain Loc --   ?   Pain Edu? --   ?   Excl. in GC? --   ? ?No  data found. ? ?Updated Vital Signs ?BP (!) 145/90 (BP Location: Left Arm)   Pulse 68   Temp 98.1 ?F (36.7 ?C) (Oral)   Resp 17   SpO2 96%  ? ?Visual Acuity ?Right Eye Distance:   ?Left Eye Distance:   ?Bilateral Distance:   ? ?Right Eye Near:   ?Left Eye Near:    ?Bilateral Near:    ? ?Physical Exam ?Vitals reviewed.  ?Constitutional:   ?   General: He is not in acute distress. ?   Appearance: He is not toxic-appearing.  ?HENT:  ?   Right Ear: Tympanic membrane and ear canal normal.  ?   Left Ear: Tympanic membrane and ear canal normal.  ?   Nose: Nose normal.  ?   Mouth/Throat:  ?   Mouth: Mucous membranes are moist.  ?   Pharynx: No oropharyngeal exudate or posterior oropharyngeal erythema.  ?Eyes:  ?   Extraocular Movements: Extraocular movements intact.  ?   Conjunctiva/sclera: Conjunctivae normal.  ?   Pupils: Pupils are equal, round, and reactive to light.  ?Cardiovascular:  ?   Rate and Rhythm: Normal rate and regular rhythm.  ?   Heart sounds: No murmur heard. ?Pulmonary:  ?   Effort: No respiratory distress.  ?   Breath sounds: No stridor. No wheezing, rhonchi or rales.  ?Musculoskeletal:  ?   Cervical back: Neck supple.  ?Lymphadenopathy:  ?   Cervical: No cervical adenopathy.  ?Skin: ?   Capillary Refill: Capillary refill takes less than 2 seconds.  ?   Coloration: Skin is not jaundiced or pale.  ?Neurological:  ?   General: No focal deficit present.  ?   Mental Status: He is alert and oriented to person, place, and time.  ?Psychiatric:     ?   Behavior: Behavior normal.  ? ? ? ?UC Treatments / Results  ?Labs ?(all labs ordered are listed, but only abnormal results are displayed) ?Labs Reviewed - No data to display ? ?EKG ? ? ?Radiology ?No results found. ? ?Procedures ?Procedures (including critical care time) ? ?Medications Ordered in UC ?Medications - No data to display ? ?Initial Impression / Assessment and Plan / UC Course  ?I have reviewed the triage vital signs and the nursing  notes. ? ?Pertinent labs & imaging results that were available during my care of the patient were reviewed by me and considered in my medical decision making (see chart for details). ? ?  ? ?We will test for COVID.  He has its risk for severe disease, so if he is  positive, he should be able to have a molnupiravir prescription sent.  This is day 2 of symptoms ? ?There are no interactions noted on my reference between Mepron and molnupiravir or between OsseoBiktarvy and molnupiravir ?Final Clinical Impressions(s) / UC Diagnoses  ? ?Final diagnoses:  ?Viral URI with cough  ? ? ? ?Discharge Instructions   ? ?  ? ?You have been swabbed for COVID, and the test will result in the next 24 hours. Our staff will call you if positive. If the test is positive, you should quarantine for 5 days.  ? ?You can take robitussin DM for the cough ? ? ? ? ? ? ?ED Prescriptions   ?None ?  ? ?PDMP not reviewed this encounter. ?  ?Zenia ResidesBanister, Daine Croker K, MD ?04/25/22 1210 ? ?  ?Zenia ResidesBanister, Derric Dealmeida K, MD ?04/25/22 1210 ? ?

## 2022-06-24 ENCOUNTER — Ambulatory Visit: Payer: Self-pay | Admitting: Infectious Diseases

## 2022-06-24 ENCOUNTER — Other Ambulatory Visit: Payer: Self-pay

## 2022-06-24 DIAGNOSIS — B079 Viral wart, unspecified: Secondary | ICD-10-CM

## 2022-06-24 DIAGNOSIS — B20 Human immunodeficiency virus [HIV] disease: Secondary | ICD-10-CM

## 2022-06-24 DIAGNOSIS — Z113 Encounter for screening for infections with a predominantly sexual mode of transmission: Secondary | ICD-10-CM

## 2022-06-24 DIAGNOSIS — Z5181 Encounter for therapeutic drug level monitoring: Secondary | ICD-10-CM

## 2022-06-24 DIAGNOSIS — B181 Chronic viral hepatitis B without delta-agent: Secondary | ICD-10-CM

## 2022-06-24 MED ORDER — ATOVAQUONE 750 MG/5ML PO SUSP: 1500.0000 mg | Freq: Every day | ORAL | 11 refills | Status: DC

## 2022-06-24 MED ORDER — IMIQUIMOD 5 % EX CREA: TOPICAL_CREAM | CUTANEOUS | 5 refills | Status: DC

## 2022-06-24 MED ORDER — BICTEGRAVIR-EMTRICITAB-TENOFOV 50-200-25 MG PO TABS
1.0000 | ORAL_TABLET | Freq: Every day | ORAL | 11 refills | Status: DC
Start: 2022-06-24 — End: 2023-06-16

## 2022-06-24 MED ORDER — ATOVAQUONE 750 MG/5ML PO SUSP: 1500.0000 mg | Freq: Every day | ORAL | 5 refills | Status: DC

## 2022-06-25 LAB — T-HELPER CELLS (CD4) COUNT (NOT AT ARMC)
CD4 % Helper T Cell: 13 % — ABNORMAL LOW (ref 33–65)
CD4 T Cell Abs: 172 /uL — ABNORMAL LOW (ref 400–1790)

## 2022-06-25 LAB — URINE CYTOLOGY ANCILLARY ONLY
Chlamydia: NEGATIVE
Comment: NEGATIVE
Comment: NORMAL
Neisseria Gonorrhea: NEGATIVE

## 2022-06-27 LAB — COMPREHENSIVE METABOLIC PANEL
AG Ratio: 1.7 (calc) (ref 1.0–2.5)
ALT: 22 U/L (ref 9–46)
AST: 21 U/L (ref 10–40)
Albumin: 4 g/dL (ref 3.6–5.1)
Alkaline phosphatase (APISO): 90 U/L (ref 36–130)
BUN/Creatinine Ratio: 14 (calc) (ref 6–22)
BUN: 18 mg/dL (ref 7–25)
CO2: 28 mmol/L (ref 20–32)
Calcium: 9.4 mg/dL (ref 8.6–10.3)
Chloride: 108 mmol/L (ref 98–110)
Creat: 1.31 mg/dL — ABNORMAL HIGH (ref 0.60–1.29)
Globulin: 2.4 g/dL (calc) (ref 1.9–3.7)
Glucose, Bld: 120 mg/dL — ABNORMAL HIGH (ref 65–99)
Potassium: 4.1 mmol/L (ref 3.5–5.3)
Sodium: 143 mmol/L (ref 135–146)
Total Bilirubin: 0.3 mg/dL (ref 0.2–1.2)
Total Protein: 6.4 g/dL (ref 6.1–8.1)

## 2022-06-27 LAB — LIPID PANEL
Cholesterol: 151 mg/dL (ref <200)
HDL: 47 mg/dL (ref >=40)
LDL Cholesterol (Calc): 90 mg/dL (calc)
Non-HDL Cholesterol (Calc): 104 mg/dL (calc) (ref <130)
Total CHOL/HDL Ratio: 3.2 (calc) (ref <5.0)
Triglycerides: 56 mg/dL (ref <150)

## 2022-06-27 LAB — HIV RNA, RTPCR W/R GT (RTI, PI,INT)
HIV 1 RNA Quant: NOT DETECTED copies/mL
HIV-1 RNA Quant, Log: NOT DETECTED Log copies/mL

## 2022-06-27 LAB — RPR: RPR Ser Ql: REACTIVE — AB

## 2022-06-27 LAB — RPR TITER: RPR Titer: 1:8 {titer} — ABNORMAL HIGH

## 2022-06-27 LAB — FLUORESCENT TREPONEMAL AB(FTA)-IGG-BLD: Fluorescent Treponemal ABS: REACTIVE — AB

## 2022-07-21 ENCOUNTER — Ambulatory Visit: Payer: Self-pay | Admitting: Podiatry

## 2022-08-05 ENCOUNTER — Other Ambulatory Visit (HOSPITAL_COMMUNITY): Payer: Self-pay

## 2022-08-12 ENCOUNTER — Other Ambulatory Visit: Payer: Self-pay | Admitting: Pharmacist

## 2022-08-12 DIAGNOSIS — B20 Human immunodeficiency virus [HIV] disease: Secondary | ICD-10-CM

## 2022-08-12 MED ORDER — BICTEGRAVIR-EMTRICITAB-TENOFOV 50-200-25 MG PO TABS: 1.0000 | ORAL_TABLET | Freq: Every day | ORAL | 0 refills | Status: AC

## 2022-08-12 NOTE — Progress Notes (Signed)
Medication Samples have been provided to the patient.  Drug name: Biktarvy        Strength: 50/200/25 mg       Qty: 14 tablets (2 bottles) LOT: [id number]   Exp.Date: 9/25  Dosing instructions: Take one tablet by mouth once daily  The patient has been instructed regarding the correct time, dose, and frequency of taking this medication, including desired effects and most common side effects.   Breeanna Galgano, PharmD, CPP, BCIDP Clinical Pharmacist Practitioner Infectious Diseases Clinical Pharmacist [facility]  

## 2022-08-30 IMAGING — US US ABDOMEN COMPLETE W/ ELASTOGRAPHY
1 series · 12 of 25 positions shown · non-contrast
Comparison: None.

CLINICAL DATA: Hepatitis-B

EXAM:
ULTRASOUND ABDOMEN
ULTRASOUND HEPATIC ELASTOGRAPHY
TECHNIQUE: Sonography of the upper abdomen was performed. In addition,
ultrasound elastography evaluation of the liver was performed. A
region of interest was placed within the right lobe of the liver.
Following application of a compressive sonographic pulse, tissue
compressibility was assessed. Multiple assessments were performed at
the selected site. Median tissue compressibility was determined.
Previously, hepatic stiffness was assessed by shear wave velocity.
Based on recently published Society of Radiologists in Ultrasound
consensus article, reporting is now recommended to be performed in
the SI units of pressure (kiloPascals) representing hepatic
stiffness/elasticity. The obtained result is compared to the
published reference standards. (cACLD = compensated Advanced Chronic
Liver Disease)

[Series 1: us abdomen complete w/elastography · 12 of 164 slices shown]
[im 7/164]
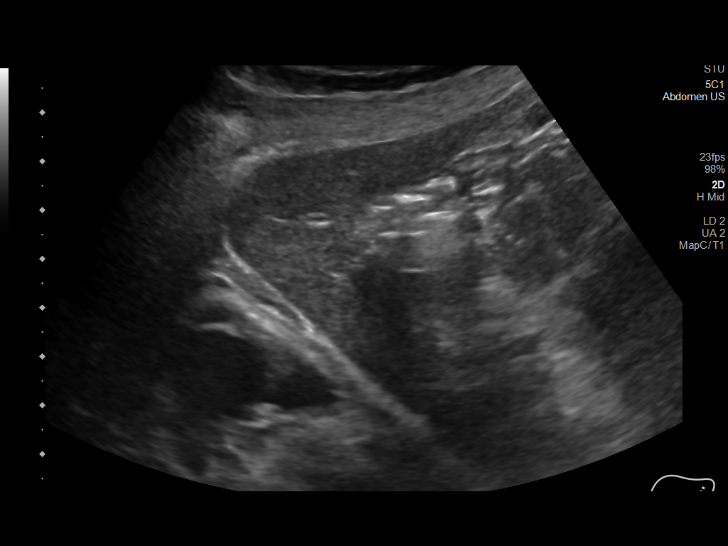
[im 21/164]
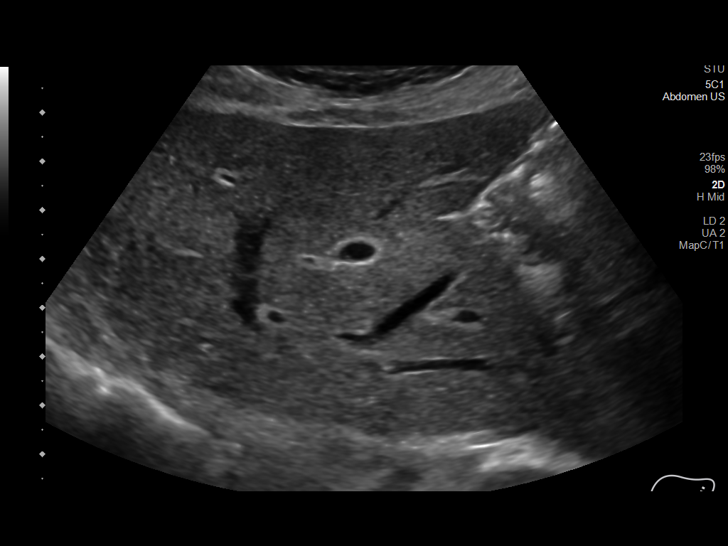
[im 34/164]
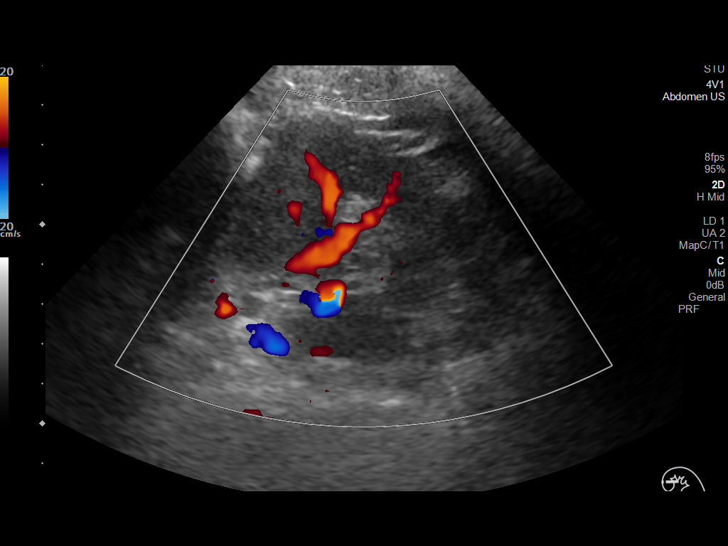
[im 48/164]
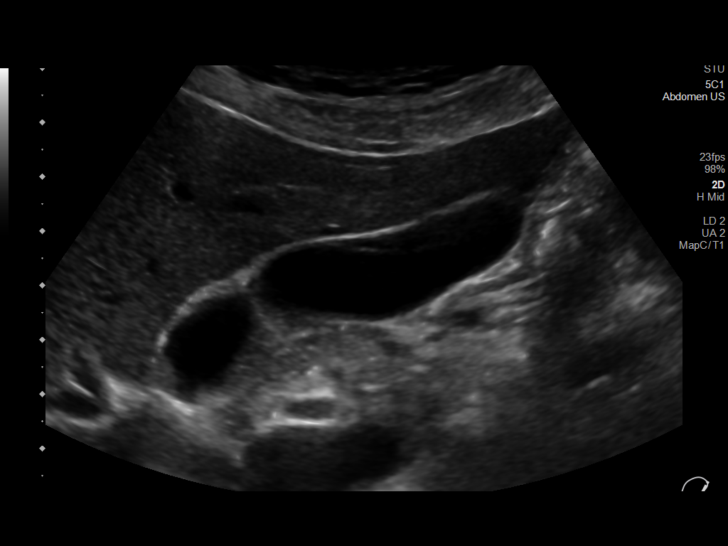
[im 62/164]
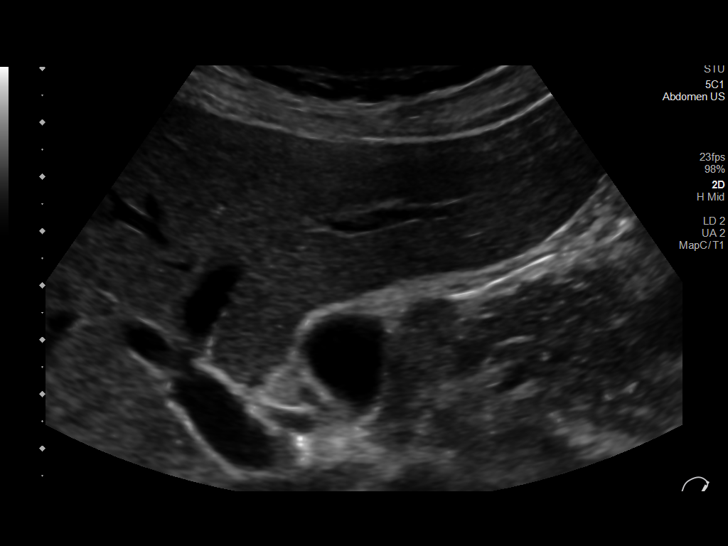
[im 75/164]
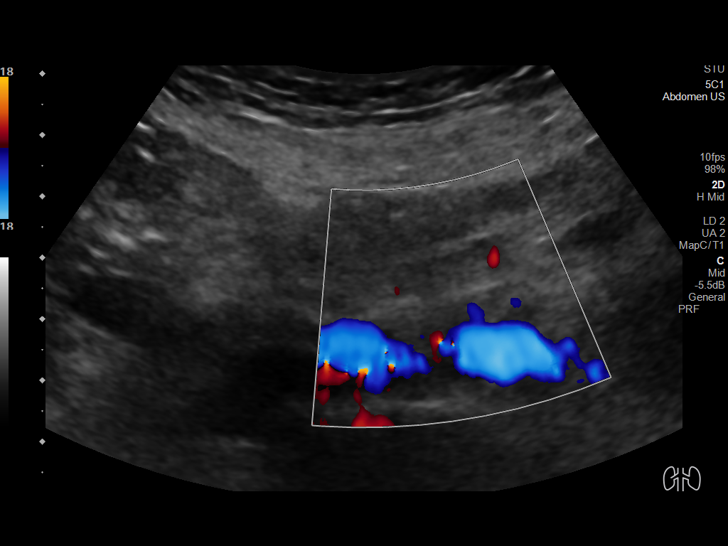
[im 89/164]
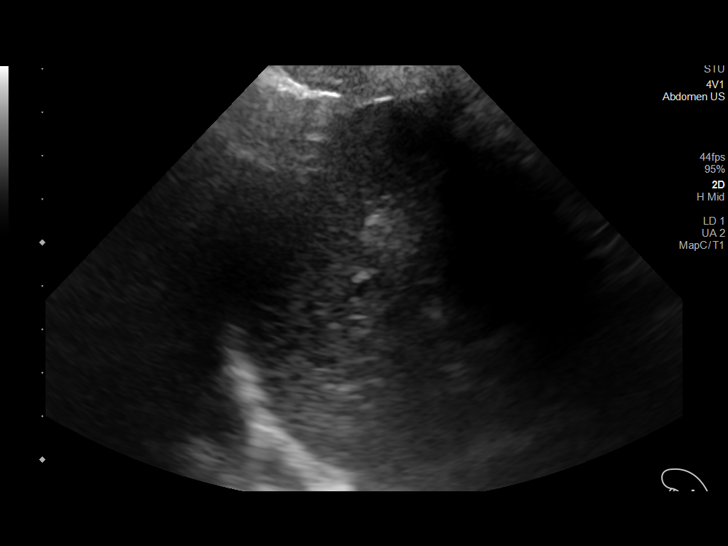
[im 102/164]
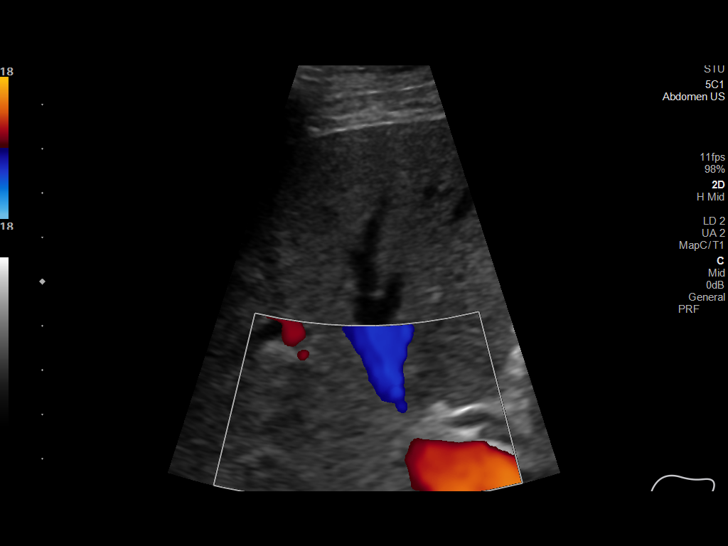
[im 116/164]
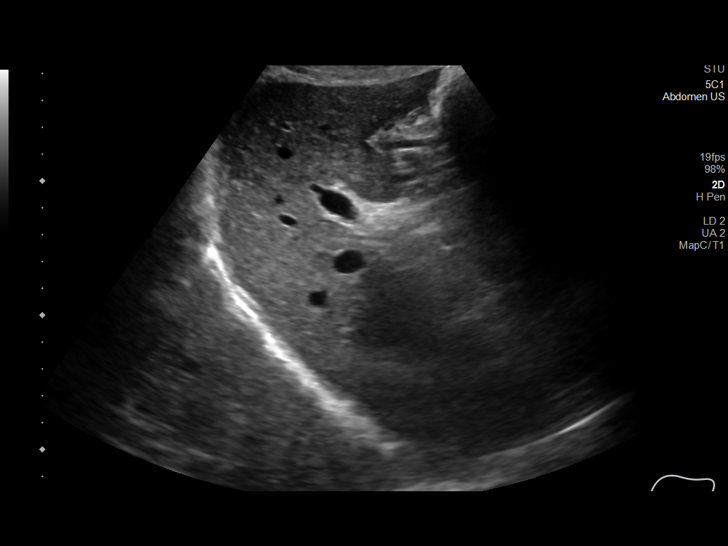
[im 130/164]
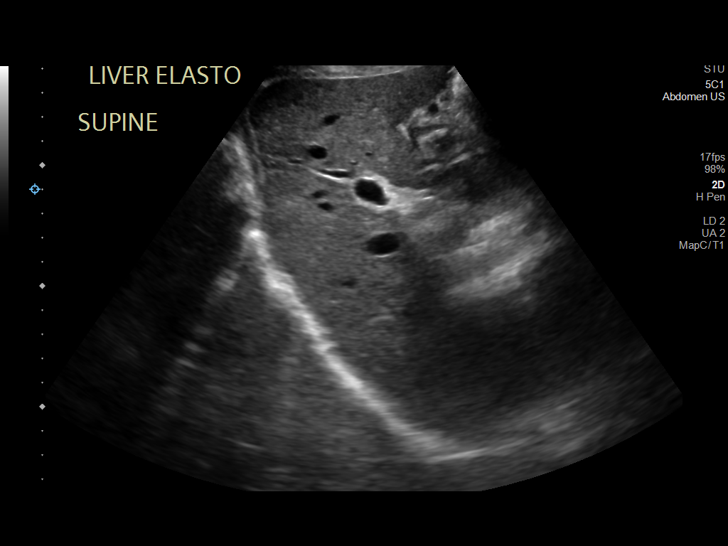
[im 143/164]
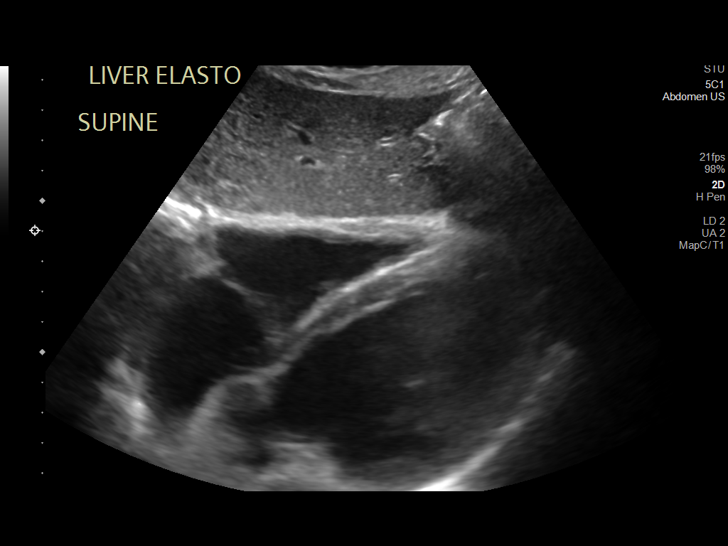
[im 157/164]
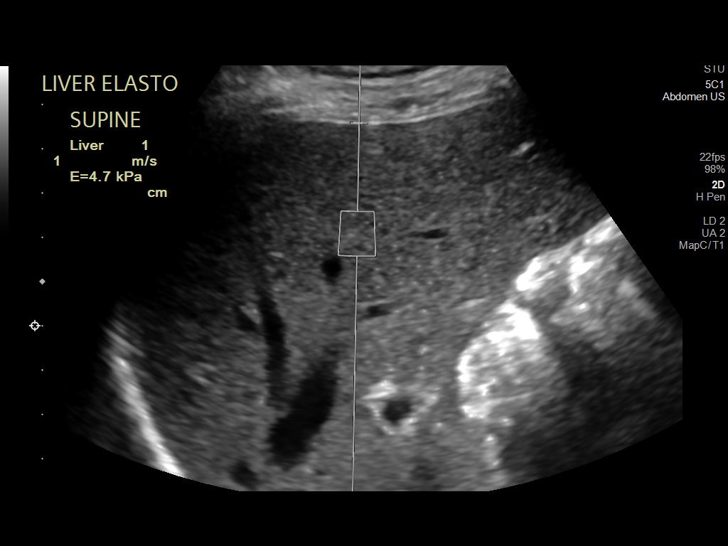

[12 of 25 positions shown; findings below may reference images not displayed]

FINDINGS: ULTRASOUND ABDOMEN

Gallbladder: No gallstones or wall thickening visualized. No
sonographic Murphy sign noted by sonographer.

Common bile duct: Diameter: 3 mm

Liver: No focal lesion identified. Coarsening of the hepatic
echotexture with slightly increased echogenicity. Portal vein is
patent on color Doppler imaging with normal direction of blood flow
towards the liver.

IVC: No abnormality visualized.

Pancreas: Visualized portion unremarkable.

Spleen: Size and appearance within normal limits.

Right Kidney: Length: 8.6 cm. Increased cortical echogenicity. No
mass or hydronephrosis visualized.

Left Kidney: Length: 9.1 cm. Increased cortical echogenicity. No
mass or hydronephrosis visualized.

Abdominal aorta: No aneurysm visualized.

Other findings: None.

ULTRASOUND HEPATIC ELASTOGRAPHY

Device: Siemens Helix VTQ

Patient position: Supine

Transducer 5C1

Number of measurements: 10

Hepatic segment:  8

Median kPa:

IQR:

IQR/Median kPa ratio:

Data quality:  Good

Diagnostic category: < or = 9 kPa: in the absence of other known
clinical signs, rules out cACLD

The use of hepatic elastography is applicable to patients with viral
hepatitis and non-alcoholic fatty liver disease. At this time, there
is insufficient data for the referenced cut-off values and use in
other causes of liver disease, including alcoholic liver disease.
Patients, however, may be assessed by elastography and serve as
their own reference standard/baseline.

In patients with non-alcoholic liver disease, the values suggesting
compensated advanced chronic liver disease (cACLD) may be lower, and
patients may need additional testing with elasticity results of [DATE]
kPa.

Please note that abnormal hepatic elasticity and shear wave
velocities may also be identified in clinical settings other than
with hepatic fibrosis, such as: acute hepatitis, elevated right
heart and central venous pressures including use of beta blockers,
Maen disease ([place]), infiltrative processes such as
mastocytosis/amyloidosis/infiltrative tumor/lymphoma, extrahepatic
cholestasis, with hyperemia in the post-prandial state, and with
liver transplantation. Correlation with patient history, laboratory
data, and clinical condition recommended.

Diagnostic Categories:

< or =5 kPa: high probability of being normal

< or =9 kPa: in the absence of other known clinical signs, rules [DATE] kPa and ?13 kPa: suggestive of cACLD, but needs further testing

>13 kPa: highly suggestive of cACLD

> or =17 kPa: highly suggestive of cACLD with an increased
probability of clinically significant portal hypertension
IMPRESSION: ULTRASOUND ABDOMEN:

Coarsening of the hepatic echotexture with slight increase in
parenchymal echogenicity, nonspecific but likely reflecting hepatic
steatosis or chronic hepatic disease. No overt hepatic lesion
visualized.

Kidney findings suggestive of medical renal disease.

ULTRASOUND HEPATIC ELASTOGRAPHY:

Median kPa:

Diagnostic category: < or = 9 kPa: in the absence of other known
clinical signs, rules out cACLD

## 2022-11-25 ENCOUNTER — Ambulatory Visit (INDEPENDENT_AMBULATORY_CARE_PROVIDER_SITE_OTHER): Payer: Self-pay

## 2022-11-25 ENCOUNTER — Ambulatory Visit (INDEPENDENT_AMBULATORY_CARE_PROVIDER_SITE_OTHER): Payer: Self-pay | Admitting: Infectious Diseases

## 2022-11-25 ENCOUNTER — Encounter: Payer: Self-pay | Admitting: Infectious Diseases

## 2022-11-25 ENCOUNTER — Other Ambulatory Visit: Payer: Self-pay

## 2022-11-25 VITALS — BP 150/93 | HR 74 | Resp 16 | Ht 70.0 in | Wt 156.0 lb

## 2022-11-25 DIAGNOSIS — Z113 Encounter for screening for infections with a predominantly sexual mode of transmission: Secondary | ICD-10-CM

## 2022-11-25 DIAGNOSIS — B181 Chronic viral hepatitis B without delta-agent: Secondary | ICD-10-CM

## 2022-11-25 DIAGNOSIS — B079 Viral wart, unspecified: Secondary | ICD-10-CM

## 2022-11-25 DIAGNOSIS — Z23 Encounter for immunization: Secondary | ICD-10-CM

## 2022-11-25 DIAGNOSIS — B20 Human immunodeficiency virus [HIV] disease: Secondary | ICD-10-CM

## 2022-11-25 DIAGNOSIS — Z5181 Encounter for therapeutic drug level monitoring: Secondary | ICD-10-CM

## 2022-11-25 DIAGNOSIS — Z7185 Encounter for immunization safety counseling: Secondary | ICD-10-CM

## 2022-11-25 MED ORDER — ATORVASTATIN CALCIUM 40 MG PO TABS: 40.0000 mg | ORAL_TABLET | Freq: Every day | ORAL | 1 refills | Status: DC

## 2022-11-25 NOTE — Progress Notes (Unsigned)
[address], [place], [place], [address]                                                                  Phn. [phone]; Fax: [phone]                                                                             Date: 06/24/22 Reason for Visit: Regular HIV fu    HPI: Jonathan Gutierrez is a 46 y.o.old male with a history of HIV.  Last seen on 01/30/22. Tolerating Biktarvy well without any issues.  Lab Results  Component Value Date   HIV1RNAQUANT NOT DETECTED 06/24/2022   Lab Results  Component Value Date   CD4TABS 172 (L) 06/24/2022   CD4TABS 207 (L) 01/22/2022   CD4TABS 149 (L) 08/21/2021   Interval H/o Seen in the ED in April 2023 for Viral URTI.  Taking Biktarvy daily without missing doses.Has not seen Dermatology as his insurance " care network ' wont cover it. Has not gotten aldara cream and mepron from pharmacy although I had prescribed him earlier. Penile warts appear to be the same size but it irritates his thighs when walking and sometimes painful. No drainage or lesions in other parts.  Denies being sexually active for a long time. Wants to see dentist at [facility].  Denies smoking, alcohol and IVDU. Works at [employer].  No concerns otherwise.    Missed one dose of Biktarvy, has not taken atovaquone for 2 months stating that [facility] does not give him anythiung else except biktarvy. He has also not received aldara cream. I had discussed with pharmacy last time as well as today regarding issues why he is not getting his meds. Cassie said she will call [facility] and see what is the issues.   Vapes, smokes marijuana and denies IVDU He works as a Hospital doctor and shifting to going on road for long trips since March 2024 Has not been sexually active since 2016.   Tells me he sees a PCP , he   does not know name and tells me they don't prescribe him anything for his DM and HLD. He showed me in his phone [facility] but  ROS: As stated in above HPI; all other systems were reviewed and are otherwise negative unless noted below  No reported fever / chills, night sweats, unintentional weight loss, acute visual change, odynophagia, chest pain/pressure, new or worsened SOB or WOB, nausea, vomiting, diarrhea, dysuria, GU discharge, syncope, seizures, red/hot swollen joints, hallucinations / delusions, rashes, new allergies, unusual / excessive bleeding, swollen lymph nodes. PMH/ PSH/ FamHx / Social Hx , medications and  allergies reviewed and updated as appropriate; please see corresponding tab in EHR / prior notes                                        Current Outpatient Medications on File Prior to Visit  Medication Sig Dispense Refill   aspirin (ASPIRIN 81) 81 MG EC tablet Take 1 tablet (81 mg total) by mouth daily. Swallow whole. 30 tablet 0   atovaquone (MEPRON) 750 MG/5ML suspension Take 10 mLs (1,500 mg total) by mouth daily with breakfast. 210 mL 5   bictegravir-emtricitabine-tenofovir AF (BIKTARVY) 50-200-25 MG TABS tablet Take 1 tablet by mouth daily. 30 tablet 11   cetirizine (ZYRTEC) 10 MG tablet Take 1 tablet (10 mg total) by mouth daily. 30 tablet 0   imiquimod (ALDARA) 5 % cream Apply topically 3 (three) times a week. Apply once daily at bedtime, 3 times/week on non consecutive days for up to 16 weeks.  Treatment area should be washed with soap and water 6-10 hrs after application 12 each 5   No current facility-administered medications on file prior to visit.    Allergies  Allergen Reactions   Bactrim [Sulfamethoxazole-Trimethoprim]    Social History   Socioeconomic History   Marital status: Single    Spouse name: Not on file   Number of children: Not on file   Years of education: Not on file   Highest education level: Not on file  Occupational  History   Not on file  Tobacco Use   Smoking status: Every Day    Types: Cigarettes, Cigars   Smokeless tobacco: Never   Tobacco comments:    Trying to quit, smoking 3 black and milds a day   Substance and Sexual Activity   Alcohol use: Not Currently   Drug use: Yes    Types: Marijuana    Comment: daily   Sexual activity: Not Currently    Partners: Male, Male    Comment: declined condoms 07/2021  Other Topics Concern   Not on file  Social History Narrative   Not on file   Social Determinants of Health   Financial Resource Strain: Not on file  Food Insecurity: Not on file  Transportation Needs: Not on file  Physical Activity: Not on file  Stress: Not on file  Social Connections: Not on file  Intimate Partner Violence: Not on file   Family History  Family history unknown: Yes    Vitals  BP (!) 150/93   Pulse 74   Resp 16   Ht 5\' 10"  (1.778 m)   Wt 156 lb (70.8 kg)   SpO2 94%   BMI 22.38 kg/m   Examination  Gen: Awake and alert  ( chaperoned by staff) Awake, alert and oriented x 3, no acute distress HEENT: [place]/AT, no scleral icterus, no pale conjunctivae, hearing normal, oral mucosa moist Neck: Supple Cardio: Regular rate and rhythm Resp: CTAB GI: nondistended GU: Penile wart    Extremities: No pedal edema  Neuro: grossly non focal  Psych: Calm, cooperative  Lab Results HIV 1 RNA Quant (copies/mL)  Date Value  06/24/2022 NOT DETECTED  01/22/2022 55 (H)  08/21/2021 <20 DETECTED (A)   CD4 T Cell Abs (/uL)  Date Value  06/24/2022 172 (L)  01/22/2022 207 (L)  08/21/2021 149 (L)   No results found for: "HIV1GENOSEQ" Lab Results  Component Value Date   WBC 6.4 05/20/2021  HGB 15.4 05/20/2021   HCT 44.9 05/20/2021   MCV 94.5 05/20/2021   PLT 168 05/20/2021    Lab Results  Component Value Date   CREATININE 1.31 (H) 06/24/2022   BUN 18 06/24/2022   NA 143 06/24/2022   K 4.1 06/24/2022   CL 108 06/24/2022   CO2 28 06/24/2022   Lab Results   Component Value Date   ALT 22 06/24/2022   AST 21 06/24/2022   BILITOT 0.3 06/24/2022    Lab Results  Component Value Date   CHOL 151 06/24/2022   TRIG 56 06/24/2022   HDL 47 06/24/2022   LDLCALC 90 06/24/2022   Lab Results  Component Value Date   HAV NON-REACTIVE 05/20/2021   Lab Results  Component Value Date   HEPBSAG REACTIVE (A) 01/22/2022   No results found for: "HCVAB" Lab Results  Component Value Date   CHLAMYDIAWP Negative 06/24/2022   N Negative 06/24/2022   No results found for: "GCPROBEAPT" No results found for: "QUANTGOLD"   Health Maintenance: Immunization History  Administered Date(s) Administered   HPV 9-valent 10/17/2020, 11/19/2020, 04/22/2021   Hepatitis A, Adult 07/06/2020, 04/22/2021   Influenza,inj,Quad PF,6+ Mos 10/13/2019, 02/11/2021, 01/22/2022   MenQuadfi_Meningococcal Groups ACYW Conjugate 02/11/2021   Meningococcal Conjugate 10/17/2020   PFIZER(Purple Top)SARS-COV-2 Vaccination 05/22/2020, 06/12/2020, 12/17/2020   PNEUMOCOCCAL CONJUGATE-20 01/22/2022   Pneumococcal Conjugate-13 12/26/2019   Tdap 07/06/2020   Problem List Items Addressed This Visit       Digestive   Chronic viral hepatitis B without delta agent and without coma (HCC)   Relevant Orders   Hepatitis B e antigen   Hepatitis B e antibody   Hepatitis B surface antigen   Hepatitis B DNA, ultraquantitative, PCR   Hepatitis B surface antibody,qualitative   Hepatitis B core antibody, total     Other   Immunization counseling   Viral warts   Relevant Orders   Ambulatory referral to General Surgery   HIV disease (HCC) - Primary   Relevant Orders   T-helper cells (CD4) count (not at [facility])   HIV RNA, RTPCR W/R GT (RTI, PI,INT)   Other Visit Diagnoses     Medication monitoring encounter       Relevant Orders   Comprehensive metabolic panel   Urinalysis   Screening for STDs (sexually transmitted diseases)       Relevant Orders   RPR   Urine cytology ancillary only        Assessment/Plan: # HIV, well controlled  - Continue Biktarvy  - Will call pharmacy to check why he is not getting atovaquone, continue as last CD4 less than 10%. Plan to stop if CD4 more than 200 today - Labs today  - Fu in 5 months   # Syphilis - s/p completion of 3 weekly doses of Benzathine pen G in 01/2022 - no acute concerns today  # Penile wart  - Will call his pharmacy and make sure he has no issues getting aldara cream  - New referral to Dermatology placed to find offices who can see him  # Chronic Hepatitis B  - Continue Biktarvy  - 1/25 ALT/AST WNL, Hep B surface ag and e antigen positive - US abdomen due in 1 months ( Black male age over 3440 for HCC screening) - Fu in 5 months   #  H/o Cardiomyoapthy with use of life vest in the past/DM/Hx pf stroke/hyperlipidemia  - On aspirin/atorvastatin and metformin - Follow up with PCP  # Immunization  COVID -  says has been vaccinated including booster Influenza - 01/22/22 Monkeypox - due Pneumococcal - PCV 20 01/22/22 Meningitis HepA - has received 2 doses in 2021 and 2022  Tdap - 2021  Shingles   # Health Maintenance -Lipid panel today  -Dental referral placed -he wants to do anal pap later  Patient's labs were reviewed as well as his previous records. Patients questions were addressed and answered. Safe sex counseling done.  Note: Per Finance, he has orange card and hence, US abdomen and speciality referral needs to be done by PCP and hence, will cancel those orders.   I have personally spent 40 minutes involved in face-to-face and non-face-to-face activities for this patient on the day of the visit.   Electronically signed by:  Odette Fraction, MD Infectious Disease Physician [facility] [address] [place], [place] [address] Phone: [phone]  Fax: [phone]

## 2022-11-26 DIAGNOSIS — Z5181 Encounter for therapeutic drug level monitoring: Secondary | ICD-10-CM | POA: Insufficient documentation

## 2022-11-26 DIAGNOSIS — Z113 Encounter for screening for infections with a predominantly sexual mode of transmission: Secondary | ICD-10-CM | POA: Insufficient documentation

## 2022-11-26 LAB — T-HELPER CELLS (CD4) COUNT (NOT AT ARMC)
CD4 % Helper T Cell: 14 % — ABNORMAL LOW (ref 33–65)
CD4 T Cell Abs: 201 /uL — ABNORMAL LOW (ref 400–1790)

## 2022-11-26 LAB — URINE CYTOLOGY ANCILLARY ONLY
Chlamydia: NEGATIVE
Comment: NEGATIVE
Comment: NORMAL
Neisseria Gonorrhea: NEGATIVE

## 2022-11-26 MED ORDER — ATOVAQUONE 750 MG/5ML PO SUSP: 1500.0000 mg | Freq: Every day | ORAL | 5 refills | Status: DC

## 2022-11-26 MED ORDER — IMIQUIMOD 5 % EX CREA: TOPICAL_CREAM | CUTANEOUS | 5 refills | Status: DC

## 2022-11-26 NOTE — Addendum Note (Signed)
Addended by: Odette Fraction on: 11/26/2022 08:51 AM   Modules accepted: Orders

## 2022-11-28 ENCOUNTER — Telehealth: Payer: Self-pay | Admitting: Pharmacist

## 2022-11-28 ENCOUNTER — Other Ambulatory Visit: Payer: Self-pay | Admitting: Pharmacist

## 2022-11-28 DIAGNOSIS — B079 Viral wart, unspecified: Secondary | ICD-10-CM

## 2022-11-28 LAB — COMPREHENSIVE METABOLIC PANEL
AG Ratio: 1.1 (calc) (ref 1.0–2.5)
ALT: 13 U/L (ref 9–46)
AST: 16 U/L (ref 10–40)
Albumin: 4 g/dL (ref 3.6–5.1)
Alkaline phosphatase (APISO): 110 U/L (ref 36–130)
BUN: 13 mg/dL (ref 7–25)
CO2: 28 mmol/L (ref 20–32)
Calcium: 9.5 mg/dL (ref 8.6–10.3)
Chloride: 105 mmol/L (ref 98–110)
Creat: 1.12 mg/dL (ref 0.60–1.29)
Globulin: 3.6 g/dL (calc) (ref 1.9–3.7)
Glucose, Bld: 142 mg/dL — ABNORMAL HIGH (ref 65–99)
Potassium: 4 mmol/L (ref 3.5–5.3)
Sodium: 140 mmol/L (ref 135–146)
Total Bilirubin: 0.5 mg/dL (ref 0.2–1.2)
Total Protein: 7.6 g/dL (ref 6.1–8.1)

## 2022-11-28 LAB — URINALYSIS
Bilirubin Urine: NEGATIVE
Glucose, UA: NEGATIVE
Hgb urine dipstick: NEGATIVE
Ketones, ur: NEGATIVE
Nitrite: NEGATIVE
Protein, ur: NEGATIVE
Specific Gravity, Urine: 1.015 (ref 1.001–1.035)
pH: 5.5 (ref 5.0–8.0)

## 2022-11-28 LAB — FLUORESCENT TREPONEMAL AB(FTA)-IGG-BLD: Fluorescent Treponemal ABS: REACTIVE — AB

## 2022-11-28 LAB — HEPATITIS B SURFACE ANTIGEN: Hepatitis B Surface Ag: REACTIVE — AB

## 2022-11-28 LAB — HEPATITIS B DNA, ULTRAQUANTITATIVE, PCR
Hepatitis B DNA: <10 IU/mL — ABNORMAL HIGH
Hepatitis B virus DNA: <1 Log IU/mL — ABNORMAL HIGH

## 2022-11-28 LAB — HEPATITIS B SURFACE ANTIBODY,QUALITATIVE: Hep B S Ab: NONREACTIVE

## 2022-11-28 LAB — HIV RNA, RTPCR W/R GT (RTI, PI,INT)
HIV 1 RNA Quant: NOT DETECTED copies/mL
HIV-1 RNA Quant, Log: NOT DETECTED Log copies/mL

## 2022-11-28 LAB — RPR: RPR Ser Ql: REACTIVE — AB

## 2022-11-28 LAB — HEPATITIS B E ANTIBODY: Hep B E Ab: NONREACTIVE

## 2022-11-28 LAB — RPR TITER: RPR Titer: 1:8 {titer} — ABNORMAL HIGH

## 2022-11-28 LAB — HEPATITIS B CORE ANTIBODY, TOTAL: Hep B Core Total Ab: NONREACTIVE

## 2022-11-28 LAB — HEPATITIS B E ANTIGEN: Hep B E Ag: REACTIVE — AB

## 2022-11-28 MED ORDER — IMIQUIMOD 5 % EX CREA: TOPICAL_CREAM | CUTANEOUS | 5 refills | Status: AC

## 2022-11-28 NOTE — Telephone Encounter (Signed)
LVM with patient requesting call back given issues with picking up Aldara cream and atovaquone from [facility] pharmacy. Aldara cream prescription sent electronically today. Do not see that either was filled in dispense history. Will await returned call.   Margarite Gouge, PharmD, CPP, BCIDP, AAHIVP Clinical Pharmacist Practitioner Infectious Diseases Clinical Pharmacist [facility]

## 2022-12-08 ENCOUNTER — Other Ambulatory Visit: Payer: Self-pay | Admitting: Infectious Diseases

## 2022-12-08 DIAGNOSIS — B079 Viral wart, unspecified: Secondary | ICD-10-CM

## 2022-12-08 NOTE — Progress Notes (Addendum)
Amb referral to Urology placed ( penile wart like lesion).

## 2022-12-30 ENCOUNTER — Other Ambulatory Visit (HOSPITAL_COMMUNITY): Payer: Self-pay

## 2023-01-01 ENCOUNTER — Other Ambulatory Visit: Payer: Self-pay | Admitting: Pharmacist

## 2023-01-01 DIAGNOSIS — B20 Human immunodeficiency virus [HIV] disease: Secondary | ICD-10-CM

## 2023-01-01 MED ORDER — BIKTARVY 50-200-25 MG PO TABS: 1.0000 | ORAL_TABLET | Freq: Every day | ORAL | 0 refills | Status: AC

## 2023-01-01 NOTE — Progress Notes (Signed)
Medication Samples have been provided to the patient.  Drug name: Biktarvy        Strength: 50/200/25 mg       Qty: 21 tablets (3 bottles)   LOT: CPBDCA   Exp.Date: 02/2025  Dosing instructions: Take one tablet by mouth once daily  The patient has been instructed regarding the correct time, dose, and frequency of taking this medication, including desired effects and most common side effects.   Damier Disano L. Laylia Mui, PharmD, BCIDP, AAHIVP, CPP Clinical Pharmacist Practitioner Infectious Diseases Clinical Pharmacist [facility] 12/10/2020, 10:07 AM  

## 2023-01-15 ENCOUNTER — Other Ambulatory Visit (HOSPITAL_COMMUNITY): Payer: Self-pay

## 2023-01-22 IMAGING — US US ABDOMEN COMPLETE
1 series · 14 of 25 positions shown · non-contrast
Comparison: US Abdomen, 09/04/2021.

CLINICAL DATA: Hepatitis-B.

EXAM:
ABDOMEN ULTRASOUND COMPLETE

[Series 1: us abdomen complete · 14 of 125 slices shown]
[im 1/125]
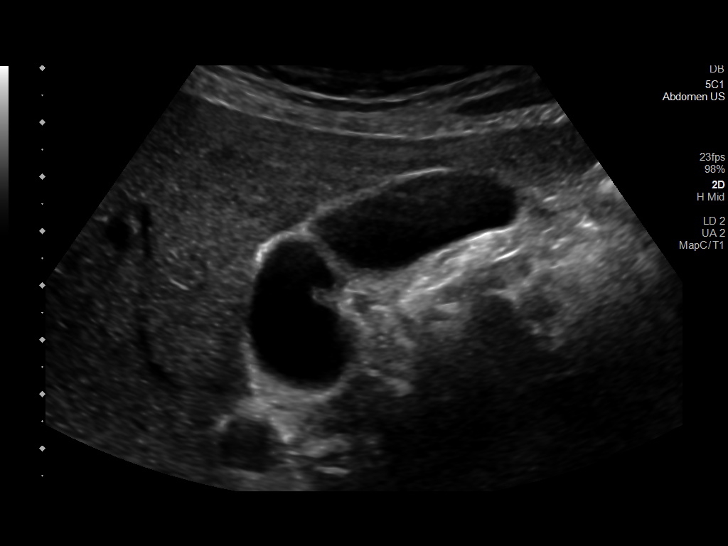
[im 11/125]
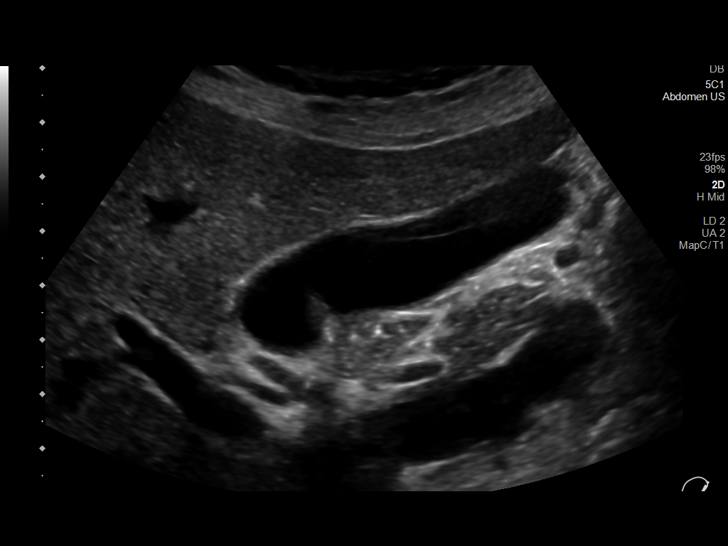
[im 21/125]
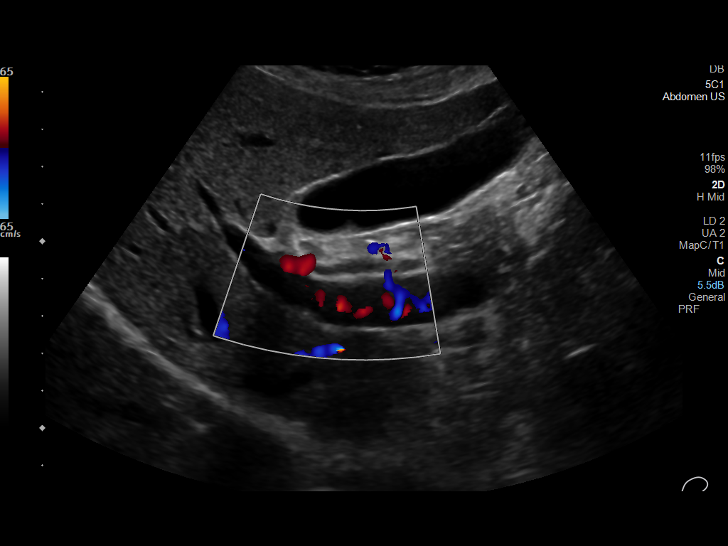
[im 32/125]
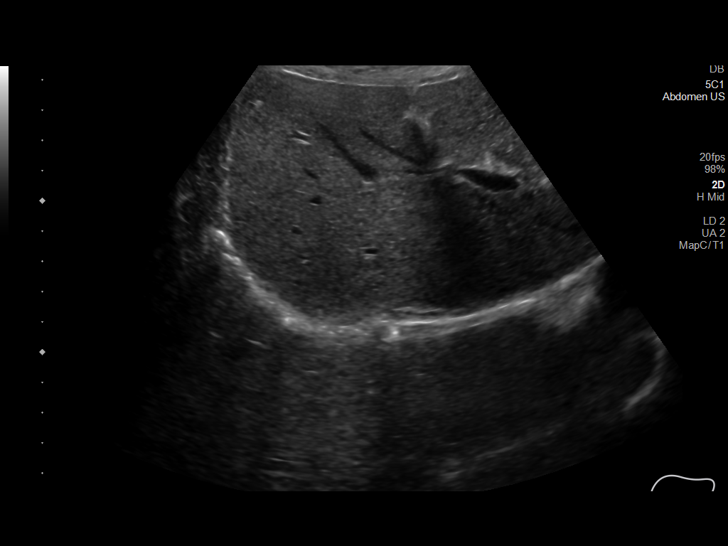
[im 42/125]
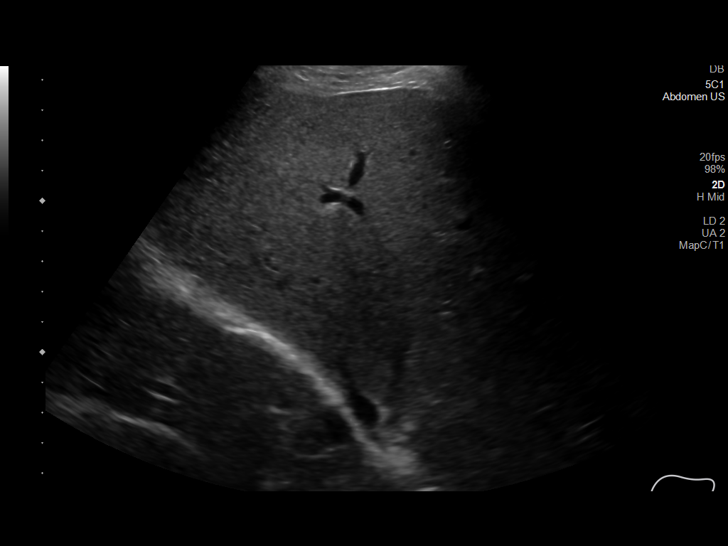
[im 47/125]
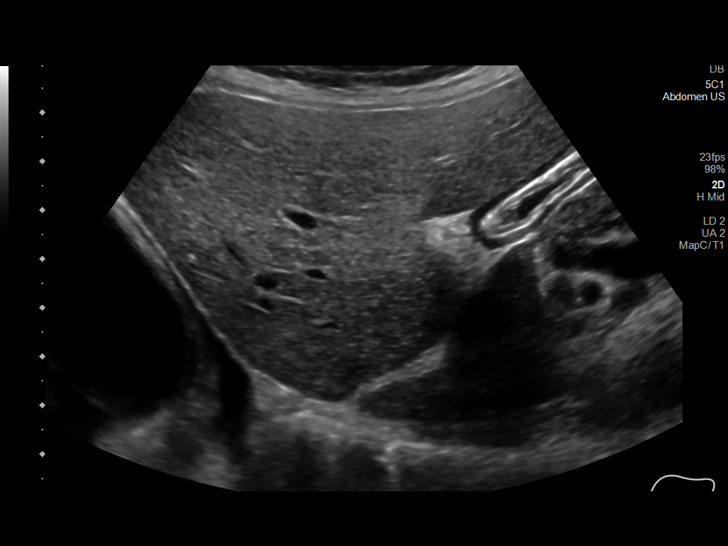
[im 57/125]
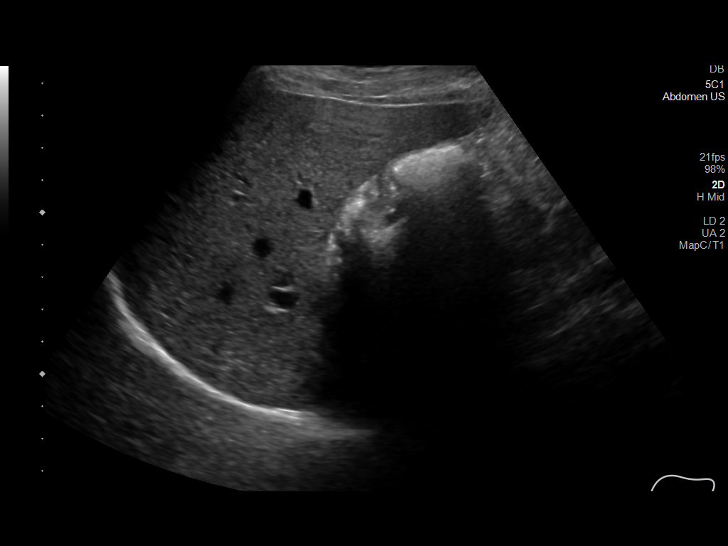
[im 68/125]
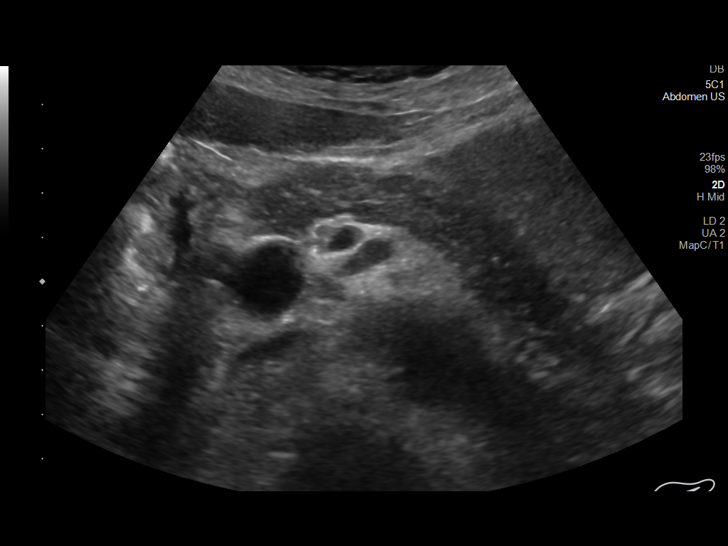
[im 78/125]
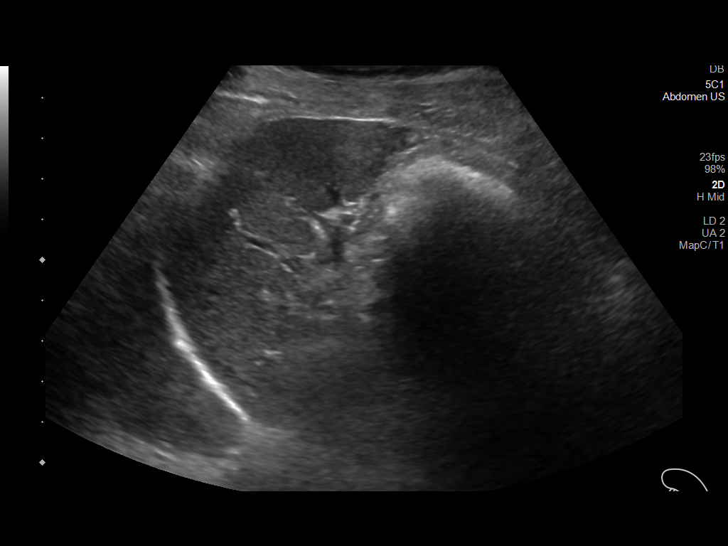
[im 83/125]
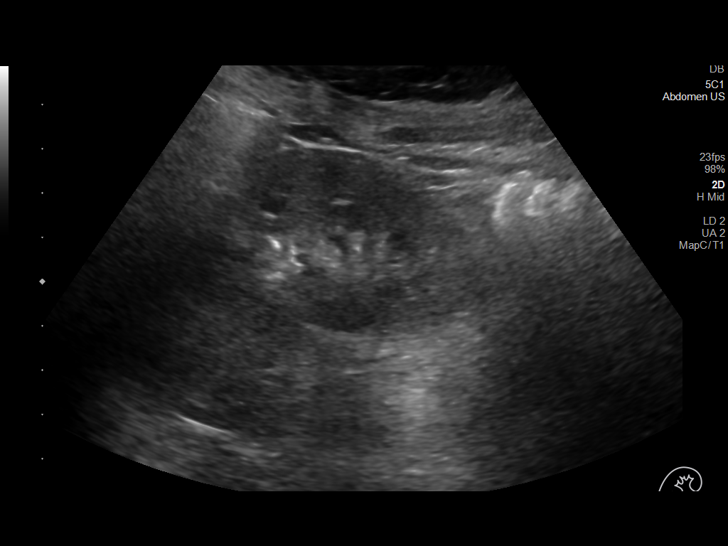
[im 94/125]
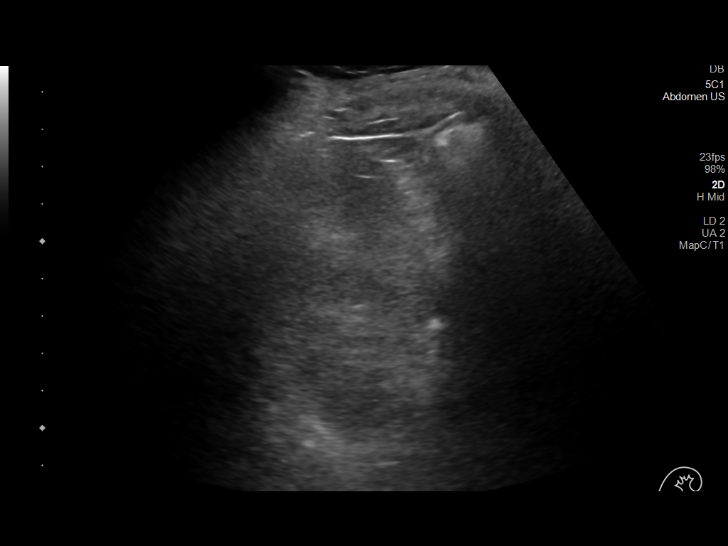
[im 104/125]
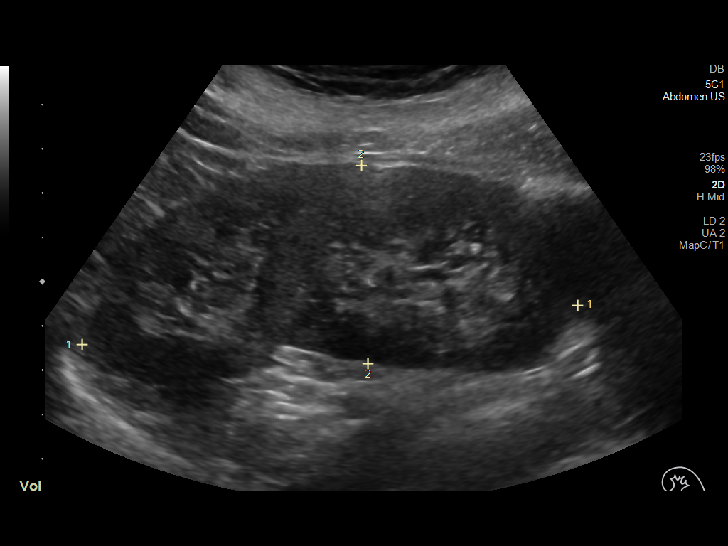
[im 114/125]
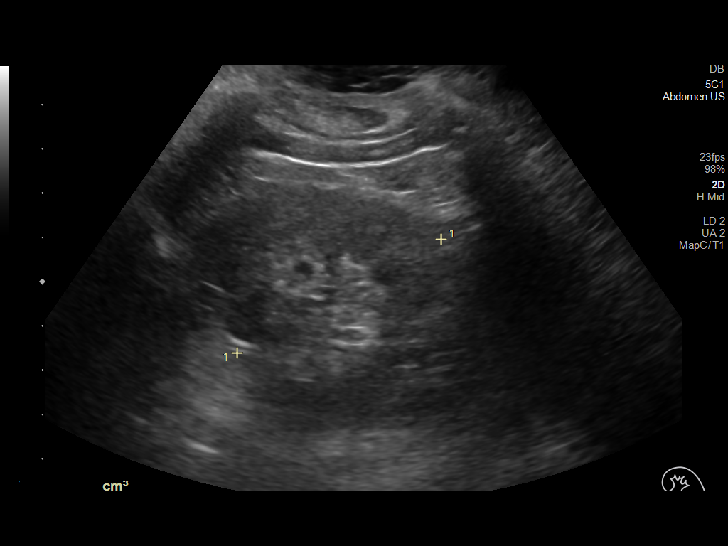
[im 125/125]
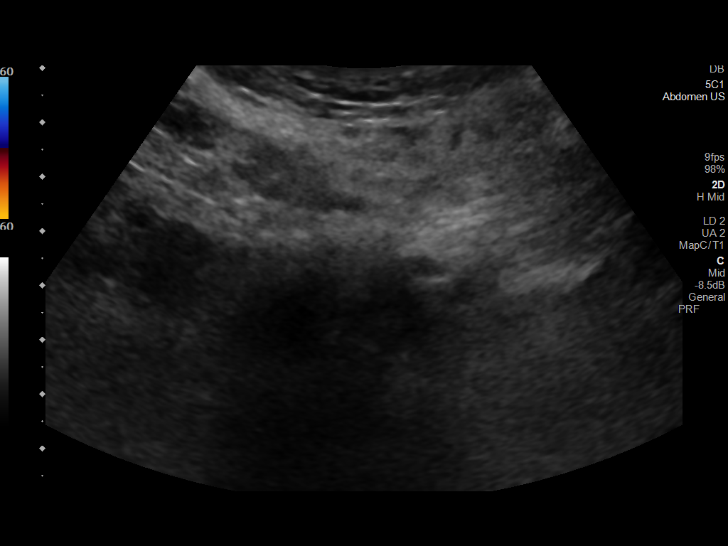

[14 of 25 positions shown; findings below may reference images not displayed]

FINDINGS: Gallbladder: No gallstones or wall thickening visualized. No
sonographic Murphy sign noted by sonographer.

Common bile duct: Diameter: 0.4 cm

Liver: No focal lesion identified. Increased hepatic parenchymal
echogenicity. Portal vein is patent on color Doppler imaging with
normal direction of blood flow towards the liver.

IVC: No abnormality visualized.

Pancreas: Visualized portion unremarkable.

Spleen: Size and appearance within normal limits.

Right Kidney: Length: 9.2 cm. Echogenicity within normal limits. No
mass or hydronephrosis visualized.

Left Kidney: Length: 11.2 cm. Echogenicity within normal limits. No
mass or hydronephrosis visualized.

Abdominal aorta: No aneurysm visualized.  Aortic atherosclerosis.

Other findings: No perihepatic ascites.
IMPRESSION: 1. Echogenic liver. Findings most commonly seen in hepatic
steatosis, though may also represent hepatitis and/or fibrosis.
2. No sonographic evidence of hepatoma.
3. Aortic Atherosclerosis ([id number]).

## 2023-01-27 ENCOUNTER — Other Ambulatory Visit: Payer: Self-pay | Admitting: Pharmacist

## 2023-01-27 DIAGNOSIS — B20 Human immunodeficiency virus [HIV] disease: Secondary | ICD-10-CM

## 2023-01-27 MED ORDER — BICTEGRAVIR-EMTRICITAB-TENOFOV 50-200-25 MG PO TABS: 1.0000 | ORAL_TABLET | Freq: Every day | ORAL | 0 refills | Status: AC

## 2023-01-27 NOTE — Progress Notes (Signed)
Medication Samples have been provided to the patient.  Drug name: Biktarvy        Strength: 50/200/25 mg       Qty: 14 tablets (2 bottles) LOT: CPBDCA   Exp.Date: 3/26  Dosing instructions: Take one tablet by mouth once daily  The patient has been instructed regarding the correct time, dose, and frequency of taking this medication, including desired effects and most common side effects.   Lurlene Ronda, PharmD, CPP, BCIDP, AAHIVP Clinical Pharmacist Practitioner Infectious Diseases Clinical Pharmacist [facility]  

## 2023-02-12 ENCOUNTER — Other Ambulatory Visit (HOSPITAL_COMMUNITY): Payer: Self-pay

## 2023-03-13 ENCOUNTER — Ambulatory Visit (INDEPENDENT_AMBULATORY_CARE_PROVIDER_SITE_OTHER): Payer: Medicare (Managed Care)

## 2023-03-13 ENCOUNTER — Ambulatory Visit (HOSPITAL_COMMUNITY)
Admission: EM | Admit: 2023-03-13 | Discharge: 2023-03-13 | Disposition: A | Discharge status: Home / Self Care | Payer: Medicare (Managed Care) | Attending: Family Medicine | Admitting: Family Medicine

## 2023-03-13 ENCOUNTER — Encounter (HOSPITAL_COMMUNITY): Payer: Self-pay

## 2023-03-13 DIAGNOSIS — M549 Dorsalgia, unspecified: Secondary | ICD-10-CM | POA: Insufficient documentation

## 2023-03-13 DIAGNOSIS — M62830 Muscle spasm of back: Secondary | ICD-10-CM | POA: Insufficient documentation

## 2023-03-13 DIAGNOSIS — M546 Pain in thoracic spine: Secondary | ICD-10-CM | POA: Diagnosis not present

## 2023-03-13 LAB — POCT URINALYSIS DIPSTICK, ED / UC
Bilirubin Urine: NEGATIVE
Glucose, UA: NEGATIVE mg/dL
Ketones, ur: NEGATIVE mg/dL
Nitrite: NEGATIVE
Protein, ur: NEGATIVE mg/dL
Specific Gravity, Urine: 1.025 (ref 1.005–1.030)
Urobilinogen, UA: 0.2 mg/dL (ref 0.0–1.0)
pH: 6 (ref 5.0–8.0)

## 2023-03-13 MED ORDER — CYCLOBENZAPRINE HCL 5 MG PO TABS: 5.0000 mg | ORAL_TABLET | Freq: Three times a day (TID) | ORAL | 0 refills | Status: DC | PRN

## 2023-03-13 NOTE — ED Provider Notes (Addendum)
[facility]   Note:  This document was prepared using Dragon voice recognition software and may include unintentional dictation errors.  MRN: [id number] DOB: June 20, 1976 DATE: 03/13/23   Subjective:  Chief Complaint:  Chief Complaint  Patient presents with   Back Pain    HPI: Jonathan Gutierrez is a 47 y.o. male with PMH of HIV presenting for right-sided upper back pain for the past 4 days.  Patient states on Wednesday he started having lower back pain, but it resolved.  However, when he woke up this morning, he noticed that he had started having right-sided upper back pain.  He describes the pain as a gripping/tightness that occurs intermittently.  Flexion and rotation appears to make the pain worse.  He has tried Aleve with no improvement.  No known injury.  Denies fever, numbness/tingling, radiation, urinary/bowel incontinence, saddle paresthesias, dysuria. Presents NAD.  Prior to Admission medications   Medication Sig Start Date End Date Taking? Authorizing Provider  cyclobenzaprine (FLEXERIL) 5 MG tablet Take 1 tablet (5 mg total) by mouth 3 (three) times daily as needed for muscle spasms. 03/13/23  Yes Dannetta Lekas P, PA-C  aspirin (ASPIRIN 81) 81 MG EC tablet Take 1 tablet (81 mg total) by mouth daily. Swallow whole. 08/23/21   Rosiland Oz, MD  atorvastatin (LIPITOR) 40 MG tablet Take 1 tablet (40 mg total) by mouth daily. 11/25/22   Rosiland Oz, MD  atovaquone (MEPRON) 750 MG/5ML suspension Take 10 mLs (1,500 mg total) by mouth daily with breakfast. 11/26/22   Rosiland Oz, MD  bictegravir-emtricitabine-tenofovir AF (BIKTARVY) 50-200-25 MG TABS tablet Take 1 tablet by mouth daily. 06/24/22   Rosiland Oz, MD  cetirizine (ZYRTEC) 10 MG tablet Take 1 tablet (10 mg total) by mouth daily. 08/23/21   Rosiland Oz, MD  imiquimod [place]) 5 % cream Apply topically 3 (three) times a week. Apply once daily at bedtime, 3 times/week on non consecutive days for up  to 16 weeks. Treatment area should be washed with soap and water [id number] hrs after application [id number]   Esmond Plants, RPH-CPP     Allergies  Allergen Reactions   Bactrim [Sulfamethoxazole-Trimethoprim]     History:   History reviewed. No pertinent past medical history.   History reviewed. No pertinent surgical history.  Family History  Family history unknown: Yes    Social History   Tobacco Use   Smoking status: Every Day    Types: Cigarettes, Cigars   Smokeless tobacco: Never   Tobacco comments:    Trying to quit, smoking 3 black and milds a day   Vaping Use   Vaping Use: Every day  Substance Use Topics   Alcohol use: Not Currently   Drug use: Yes    Types: Marijuana    Comment: daily    Review of Systems  Constitutional:  Negative for fever.  Respiratory:  Negative for cough.   Gastrointestinal:  Negative for abdominal pain, nausea and vomiting.  Genitourinary:  Positive for flank pain. Negative for dysuria.  Musculoskeletal:  Positive for back pain and myalgias.  Neurological:  Negative for numbness.     Objective:   Vitals: BP 137/81 (BP Location: Left Arm)   Pulse 64   Temp 98.4 F (36.9 C) (Oral)   Resp 16   SpO2 97%   Physical Exam Constitutional:      General: He is not in acute distress.    Appearance: Normal appearance. He is well-developed and normal weight. He is not ill-appearing or toxic-appearing.  HENT:     Head: Normocephalic and atraumatic.  Cardiovascular:     Rate and Rhythm: Normal rate and regular rhythm.     Heart sounds: Normal heart sounds.  Pulmonary:     Effort: Pulmonary effort is normal.     Breath sounds: Normal breath sounds.     Comments: Clear to auscultation bilaterally  Abdominal:     General: Bowel sounds are normal.     Palpations: Abdomen is soft.     Tenderness: There is no abdominal tenderness. There is no right CVA tenderness or left CVA tenderness.  Musculoskeletal:     Thoracic back: Spasms present.  No tenderness. Decreased range of motion.     Lumbar back: No tenderness. Normal range of motion.     Comments: Decreased range of motion upper back due to pain with flexion and rotation.  Nontender to palpation.  No CVA tenderness.  Skin:    General: Skin is warm and dry.  Neurological:     General: No focal deficit present.     Mental Status: He is alert.  Psychiatric:        Mood and Affect: Mood and affect normal.     Results:  Labs: Results for orders placed or performed during the hospital encounter of 03/13/23 (from the past 24 hour(s))  POC Urinalysis dipstick     Status: Abnormal   Collection Time: 03/13/23  2:30 PM  Result Value Ref Range   Glucose, UA NEGATIVE NEGATIVE mg/dL   Bilirubin Urine NEGATIVE NEGATIVE   Ketones, ur NEGATIVE NEGATIVE mg/dL   Specific Gravity, Urine 1.025 1.005 - 1.030   Hgb urine dipstick TRACE (A) NEGATIVE   pH 6.0 5.0 - 8.0   Protein, ur NEGATIVE NEGATIVE mg/dL   Urobilinogen, UA 0.2 0.0 - 1.0 mg/dL   Nitrite NEGATIVE NEGATIVE   Leukocytes,Ua SMALL (A) NEGATIVE    Radiology: DG Thoracic Spine 2 View  Result Date: 03/13/2023 CLINICAL DATA:  Upper back pain. EXAM: THORACIC SPINE 2 VIEWS COMPARISON:  None Available. FINDINGS: There are 12 pairs of ribs. There is minimal left convex curvature of the midthoracic spine. There is no listhesis. No fracture is identified. Disc space heights are preserved. The included portions of the lungs are grossly clear. IMPRESSION: No evidence of acute osseous abnormality or significant degenerative changes. Electronically Signed   By: Logan Bores M.D.   On: 03/13/2023 14:52     UC Course/Treatments:  Procedures: Procedures   Medications Ordered in UC: Medications - No data to display   Assessment and Plan :     ICD-10-CM   1. Upper back pain on right side  M54.9     2. Spasm of thoracic back muscle  M62.830      Upper back pain on right side: Afebrile, nontoxic-appearing, NAD. VSS. DDX includes  but not limited to: Strain, nephrolithiasis, pyelonephritis UA was ordered given location of pain.  UA was positive for leukocytes and trace Hgb.  Culture is pending.  Antibiotics not prescribed at this time given no dysuria.  Will call patient with results and will send ABX if needed.  Imaging was unremarkable.  Flexeril was prescribed for muscle spasms.  Recommend RICE regimen and OTC analgesics as needed for pain.  Strict ED precautions were given and patient verbalized understanding  Spasm of thoracic back muscle Afebrile, nontoxic-appearing, NAD. VSS. DDX includes but not limited to: Strain, nephrolithiasis, pyelonephritis UA was ordered given location of pain.  UA was positive for leukocytes and trace  Hgb.  Culture is pending.  Antibiotics not prescribed at this time given no dysuria.  Will call patient with results and will send ABX if needed.  Flexeril was prescribed for muscle spasms.  Recommend RICE regimen and OTC analgesics as needed for pain.  Strict ED precautions were given and patient verbalized understanding   ED Discharge Orders          Ordered    cyclobenzaprine (FLEXERIL) 5 MG tablet  3 times daily PRN        03/13/23 1503             I have reviewed the PDMP during this encounter.     Maude Hettich P, PA-C 03/13/23 1524    Minnette Merida P, PA-C 03/13/23 1524

## 2023-03-13 NOTE — Discharge Instructions (Signed)
Your x-rays showed no broken bones. You were given a prescription for a muscle relaxer (Flexeril) to take at bedtime when you are not driving or working because it may cause dizziness/drowsiness. Recommend rest, ice, compression, and elevation. You can alternate Tylenol and NSAIDs (Ibuprofen, Alleve) as needed for pain if you are not allergic.  Return in 2 to 3 days if no improvement. Your evaluation was not suggestive of any emergent condition requiring medical intervention at this time. However, our office is limited in the tests and imaging we can perform at our facility.  Therefore, it is very important for you to pay attention to any new symptoms or worsening of your current condition.   Please go directly to the Emergency Department immediately should you begin to feel worse in any way or have any of the following symptoms: bowel/urinary incontinence, numbness between your legs, fever new onset numbness/tingling.

## 2023-03-13 NOTE — ED Triage Notes (Signed)
Patient with c/o right lower back pain for about 4 days. Patient states today he had some back spasms that radiated up his side into his shoulder. Denies injury.

## 2023-03-15 LAB — URINE CULTURE: Culture: >=100000 — AB

## 2023-03-16 ENCOUNTER — Telehealth (HOSPITAL_COMMUNITY): Payer: Self-pay | Admitting: Emergency Medicine

## 2023-03-16 MED ORDER — CEPHALEXIN 500 MG PO CAPS: 500.0000 mg | ORAL_CAPSULE | Freq: Two times a day (BID) | ORAL | 0 refills | Status: AC

## 2023-05-12 ENCOUNTER — Ambulatory Visit: Payer: Self-pay | Admitting: Infectious Diseases

## 2023-06-02 ENCOUNTER — Ambulatory Visit: Payer: Self-pay | Admitting: Infectious Diseases

## 2023-06-09 ENCOUNTER — Ambulatory Visit: Payer: PRIVATE HEALTH INSURANCE | Admitting: Nurse Practitioner

## 2023-06-16 ENCOUNTER — Other Ambulatory Visit: Payer: Self-pay

## 2023-06-16 ENCOUNTER — Ambulatory Visit: Payer: PRIVATE HEALTH INSURANCE | Admitting: Infectious Diseases

## 2023-06-16 ENCOUNTER — Encounter: Payer: Self-pay | Admitting: Infectious Diseases

## 2023-06-16 VITALS — BP 133/78 | HR 63 | Temp 98.2°F | Ht 71.0 in | Wt 158.0 lb

## 2023-06-16 DIAGNOSIS — B20 Human immunodeficiency virus [HIV] disease: Secondary | ICD-10-CM | POA: Diagnosis not present

## 2023-06-16 DIAGNOSIS — B181 Chronic viral hepatitis B without delta-agent: Secondary | ICD-10-CM | POA: Diagnosis not present

## 2023-06-16 DIAGNOSIS — Z113 Encounter for screening for infections with a predominantly sexual mode of transmission: Secondary | ICD-10-CM

## 2023-06-16 DIAGNOSIS — B079 Viral wart, unspecified: Secondary | ICD-10-CM

## 2023-06-16 LAB — COMPREHENSIVE METABOLIC PANEL
Albumin: 4.2 g/dL (ref 3.6–5.1)
Potassium: 4.1 mmol/L (ref 3.5–5.3)

## 2023-06-16 LAB — CBC
Hemoglobin: 15 g/dL (ref 13.2–17.1)
MCHC: 33.6 g/dL (ref 32.0–36.0)
MCV: 86.9 fL (ref 80.0–100.0)
WBC: 5.3 10*3/uL (ref 3.8–10.8)

## 2023-06-16 LAB — LIPID PANEL
Cholesterol: 176 mg/dL (ref <200)
Non-HDL Cholesterol (Calc): 127 mg/dL (calc) (ref <130)

## 2023-06-16 MED ORDER — BICTEGRAVIR-EMTRICITAB-TENOFOV 50-200-25 MG PO TABS: 1.0000 | ORAL_TABLET | Freq: Every day | ORAL | 11 refills | Status: DC

## 2023-06-16 NOTE — Progress Notes (Unsigned)
[address], [place], [place], [address]                                                                  Phn. [phone]; Fax: [phone]                                                                             Date: 06/16/23 Reason for Visit: Regular HIV fu    HPI: Jonathan Gutierrez is a 47 y.o.old male with a history of HIV.  Last seen on 06/24/22, Tolerating Biktarvy well without any issues.  Lab Results  Component Value Date   HIV1RNAQUANT NOT DETECTED 11/25/2022   Lab Results  Component Value Date   CD4TABS 201 (L) 11/25/2022   CD4TABS 172 (L) 06/24/2022   CD4TABS 207 (L) 01/22/2022     Interval H/o Compliant with Biktarvy but has not been able to get atovaquone due to unclear reasons. Was seen in the ED in 3/15 for thoracic back spasm and was discharged on cyclobenzaprine. Has an upcoming appt with PCP. Smokes weed. Denies alcohol and IVDU. Denies being sexually active with no plans in future as well. Works in [employer].   He was unable to see dermatology as well as Urology due to no insurance and has been applying aldara cream for penile wart and reports its shrinking. He wants to get a new Derm referral as he has insurance now. He would like to get covid vaccine if due as he is exposed to a lot of people at work. Doing well with no complaints otherwise.   ROS: As stated in above HPI; all other systems were reviewed and are otherwise negative unless noted below  No reported fever / chills, night sweats, unintentional weight loss, acute visual change, odynophagia, chest pain/pressure, new or worsened SOB or WOB, nausea, vomiting, diarrhea, dysuria, GU discharge, syncope, seizures, red/hot swollen joints, hallucinations / delusions, rashes, new allergies, unusual / excessive  bleeding, swollen lymph nodes.  PMH/ PSH/ FamHx / Social Hx , medications and allergies reviewed and updated as appropriate; please see corresponding tab in EHR / prior notes                                        Current Outpatient Medications on File Prior to Visit  Medication Sig Dispense Refill   aspirin (ASPIRIN 81) 81 MG EC tablet Take 1 tablet (81 mg total) by mouth daily. Swallow whole. 30 tablet 0   bictegravir-emtricitabine-tenofovir AF (BIKTARVY) 50-200-25 MG TABS tablet Take 1 tablet by mouth daily.  30 tablet 11   atorvastatin (LIPITOR) 40 MG tablet Take 1 tablet (40 mg total) by mouth daily. (Patient not taking: Reported on 06/16/2023) 30 tablet 1   atovaquone (MEPRON) 750 MG/5ML suspension Take 10 mLs (1,500 mg total) by mouth daily with breakfast. (Patient not taking: Reported on 06/16/2023) 210 mL 5   cetirizine (ZYRTEC) 10 MG tablet Take 1 tablet (10 mg total) by mouth daily. (Patient not taking: Reported on 06/16/2023) 30 tablet 0   cyclobenzaprine (FLEXERIL) 5 MG tablet Take 1 tablet (5 mg total) by mouth 3 (three) times daily as needed for muscle spasms. (Patient not taking: Reported on 06/16/2023) 15 tablet 0   imiquimod (ALDARA) 5 % cream Apply topically 3 (three) times a week. Apply once daily at bedtime, 3 times/week on non consecutive days for up to 16 weeks. Treatment area should be washed with soap and water 6-10 hrs after application (Patient not taking: Reported on 06/16/2023) 12 each 5   No current facility-administered medications on file prior to visit.    Allergies  Allergen Reactions   Bactrim [Sulfamethoxazole-Trimethoprim]    Social History   Socioeconomic History   Marital status: Single    Spouse name: Not on file   Number of children: Not on file   Years of education: Not on file   Highest education level: Not on file  Occupational History   Not on file  Tobacco Use   Smoking status: Every Day    Types: Cigarettes, Cigars   Smokeless tobacco: Never    Tobacco comments:    Trying to quit, smoking 3 black and milds a day   Vaping Use   Vaping Use: Every day  Substance and Sexual Activity   Alcohol use: Not Currently   Drug use: Yes    Types: Marijuana    Comment: daily   Sexual activity: Not Currently    Partners: Male, Male    Comment: declined condoms 07/2021  Other Topics Concern   Not on file  Social History Narrative   Not on file   Social Determinants of Health   Financial Resource Strain: Not on file  Food Insecurity: Not on file  Transportation Needs: Not on file  Physical Activity: Not on file  Stress: Not on file  Social Connections: Not on file  Intimate Partner Violence: Not on file   Family History  Family history unknown: Yes    Vitals  BP 133/78   Pulse 63   Temp 98.2 F (36.8 C) (Temporal)   Ht 5\' 11"  (1.803 m)   Wt 158 lb (71.7 kg)   SpO2 96%   BMI 22.04 kg/m    Examination  Gen: Awake, alert and oriented x 3, no acute distress HEENT: [place]/AT, no scleral icterus, no pale conjunctivae, hearing normal, oral mucosa moist Neck: Supple Cardio: Regular rate and rhythm Resp: CTAB GI: nondistended GU: Penile wart in the shaft appears to be smaller than last seen approx  1 to 1.5 cm in length and breadth. No signs of infection or discharge, Exam with chaperone Extremities: No pedal edema  Neuro: grossly non focal  Psych: Calm, cooperative  Lab Results HIV 1 RNA Quant (copies/mL)  Date Value  11/25/2022 NOT DETECTED  06/24/2022 NOT DETECTED  01/22/2022 55 (H)   CD4 T Cell Abs (/uL)  Date Value  11/25/2022 201 (L)  06/24/2022 172 (L)  01/22/2022 207 (L)   No results found for: "HIV1GENOSEQ" Lab Results  Component Value Date   WBC 6.4 05/20/2021  HGB 15.4 05/20/2021   HCT 44.9 05/20/2021   MCV 94.5 05/20/2021   PLT 168 05/20/2021    Lab Results  Component Value Date   CREATININE 1.12 11/25/2022   BUN 13 11/25/2022   NA 140 11/25/2022   K 4.0 11/25/2022   CL 105 11/25/2022    CO2 28 11/25/2022   Lab Results  Component Value Date   ALT 13 11/25/2022   AST 16 11/25/2022   BILITOT 0.5 11/25/2022    Lab Results  Component Value Date   CHOL 151 06/24/2022   TRIG 56 06/24/2022   HDL 47 06/24/2022   LDLCALC 90 06/24/2022   Lab Results  Component Value Date   HAV NON-REACTIVE 05/20/2021   Lab Results  Component Value Date   HEPBSAG REACTIVE (A) 11/25/2022   HEPBSAB NON-REACTIVE 11/25/2022   No results found for: "HCVAB" Lab Results  Component Value Date   CHLAMYDIAWP Negative 11/25/2022   N Negative 11/25/2022   No results found for: "GCPROBEAPT" No results found for: "QUANTGOLD"   Health Maintenance: Immunization History  Administered Date(s) Administered   COVID-19, mRNA, vaccine(Comirnaty)12 years and older 11/25/2022   HPV 9-valent 10/17/2020, 11/19/2020, 04/22/2021   Hepatitis A, Adult 07/06/2020, 04/22/2021   Influenza,inj,Quad PF,6+ Mos 10/13/2019, 02/11/2021, 01/22/2022, 11/25/2022   MenQuadfi_Meningococcal Groups ACYW Conjugate 02/11/2021   Meningococcal Conjugate 10/17/2020   PFIZER(Purple Top)SARS-COV-2 Vaccination 05/22/2020, 06/12/2020, 12/17/2020   PNEUMOCOCCAL CONJUGATE-20 01/22/2022   Pneumococcal Conjugate-13 12/26/2019   Tdap 07/06/2020    Assessment/Plan: # HIV,/AIDs, well controlled  - Continue Biktarvy  - Labs today  - Fu in 6 months    # STD screening - Urine GC and RPR - No acute concerns   # Penile wart  - continue aldara cream  - New referral to dermatology   # Chronic Hepatitis B  - Continue Biktarvy  - Labs for Hep B  #  H/o Cardiomyoapthy with use of life vest in the past/DM/Hx pf stroke/hyperlipidemia  - He is only on aspirin, neither taking atorvastatin or meds for DM  - he has an upcoming appt with PCP and instructed not to miss it  # Immunization  Hepatitis A 07/06/2020, 04/02/2021 HPV 9 07/06/2020, 04/02/2021 COVID - 05/12/2020, 06/12/2020, 12/17/20, 11/25/2022, not due for covid vaccine booster  (  less than 65 years, HIV well controlled wirh CD4 last more than 200) Influenza - 11/25/22 Monkeypox - due Pneumococcal - PCV 13, 12/26/19 PCV 20 01/22/22 Meningococcal - 10,20/2021, 02/11/2021 HepA - has received 2 doses in 2021 and 2022  Tdap - 2021  Shingles   # Health Maintenance -he wants to do anal pap later  Patient's labs were reviewed as well as his previous records. Patients questions were addressed and answered. Safe sex counseling done.  I have personally spent 40 minutes involved in face-to-face and non-face-to-face activities for this patient on the day of the visit.   Electronically signed by:  Odette Fraction, MD Infectious Disease Physician [facility] [address] [place], [place] [address] Phone: [phone]  Fax: [phone]

## 2023-06-17 LAB — URINE CYTOLOGY ANCILLARY ONLY
Chlamydia: NEGATIVE
Comment: NEGATIVE
Comment: NORMAL
Neisseria Gonorrhea: NEGATIVE

## 2023-06-17 LAB — HEPATITIS B E ANTIBODY: Hep B E Ab: NONREACTIVE

## 2023-06-17 LAB — T-HELPER CELLS (CD4) COUNT (NOT AT ARMC)
CD4 % Helper T Cell: 13 % — ABNORMAL LOW (ref 33–65)
CD4 T Cell Abs: 220 /uL — ABNORMAL LOW (ref 400–1790)

## 2023-06-17 LAB — RPR TITER: RPR Titer: 1:8 {titer} — ABNORMAL HIGH

## 2023-06-17 LAB — RPR: RPR Ser Ql: REACTIVE — AB

## 2023-06-18 LAB — HEPATITIS B DNA, ULTRAQUANTITATIVE, PCR: Hepatitis B virus DNA: <1 Log IU/mL — ABNORMAL HIGH

## 2023-06-19 LAB — HEPATITIS B E ANTIGEN: Hep B E Ag: REACTIVE — AB

## 2023-06-19 LAB — T PALLIDUM AB: T Pallidum Abs: POSITIVE — AB

## 2023-06-19 LAB — COMPREHENSIVE METABOLIC PANEL
AG Ratio: 1.6 (calc) (ref 1.0–2.5)
ALT: 16 U/L (ref 9–46)
AST: 17 U/L (ref 10–40)
Alkaline phosphatase (APISO): 110 U/L (ref 36–130)
BUN: 17 mg/dL (ref 7–25)
CO2: 30 mmol/L (ref 20–32)
Calcium: 9.3 mg/dL (ref 8.6–10.3)
Chloride: 107 mmol/L (ref 98–110)
Creat: 1.2 mg/dL (ref 0.60–1.29)
Globulin: 2.6 g/dL (calc) (ref 1.9–3.7)
Glucose, Bld: 125 mg/dL — ABNORMAL HIGH (ref 65–99)
Sodium: 140 mmol/L (ref 135–146)
Total Bilirubin: 0.3 mg/dL (ref 0.2–1.2)
Total Protein: 6.8 g/dL (ref 6.1–8.1)

## 2023-06-19 LAB — CBC
HCT: 44.6 % (ref 38.5–50.0)
MCH: 29.2 pg (ref 27.0–33.0)
MPV: 11.4 fL (ref 7.5–12.5)
Platelets: 205 10*3/uL (ref 140–400)
RBC: 5.13 10*6/uL (ref 4.20–5.80)
RDW: 14.5 % (ref 11.0–15.0)

## 2023-06-19 LAB — HIV RNA, RTPCR W/R GT (RTI, PI,INT)
HIV 1 RNA Quant: NOT DETECTED copies/mL
HIV-1 RNA Quant, Log: NOT DETECTED Log copies/mL

## 2023-06-19 LAB — LIPID PANEL
HDL: 49 mg/dL (ref >=40)
LDL Cholesterol (Calc): 113 mg/dL (calc) — ABNORMAL HIGH
Total CHOL/HDL Ratio: 3.6 (calc) (ref <5.0)
Triglycerides: 57 mg/dL (ref <150)

## 2023-06-19 LAB — HEPATITIS B SURFACE ANTIGEN: Hepatitis B Surface Ag: REACTIVE — AB

## 2023-06-19 LAB — HEPATITIS B DNA, ULTRAQUANTITATIVE, PCR: Hepatitis B DNA: <10 IU/mL — ABNORMAL HIGH

## 2023-06-21 ENCOUNTER — Encounter (HOSPITAL_COMMUNITY): Payer: Self-pay | Admitting: *Deleted

## 2023-06-21 ENCOUNTER — Ambulatory Visit (HOSPITAL_COMMUNITY)
Admission: EM | Admit: 2023-06-21 | Discharge: 2023-06-21 | Disposition: A | Discharge status: Home / Self Care | Payer: PRIVATE HEALTH INSURANCE | Attending: Emergency Medicine | Admitting: Emergency Medicine

## 2023-06-21 DIAGNOSIS — R220 Localized swelling, mass and lump, head: Secondary | ICD-10-CM

## 2023-06-21 DIAGNOSIS — K0889 Other specified disorders of teeth and supporting structures: Secondary | ICD-10-CM | POA: Diagnosis not present

## 2023-06-21 HISTORY — DX: Asymptomatic human immunodeficiency virus (hiv) infection status: Z21

## 2023-06-21 MED ORDER — AMOXICILLIN-POT CLAVULANATE 875-125 MG PO TABS: 1.0000 | ORAL_TABLET | Freq: Two times a day (BID) | ORAL | 0 refills | Status: DC

## 2023-06-21 MED ORDER — NYSTATIN 100000 UNIT/ML MT SUSP: 5.0000 mL | Freq: Four times a day (QID) | OROMUCOSAL | 0 refills | Status: DC | PRN

## 2023-06-21 NOTE — Discharge Instructions (Signed)
Today you were evaluated for your dental pain and swelling, will move forward with treatment of infection  Begin Augmentin every morning and every evening for 7 days Gargle and spit Magic mouthwash solution every 4-6 hours as needed for comfort  May take ibuprofen 600 to 800 mg every 6-8 hours and/or Tylenol 500 to 1000 mg every 6 hours for management of pain  May attempt use of salt water gargles, Listerine gargles, soft foods and warm liquids for additional comfort  You have been given information for local dentist office, please follow-up for reevaluation

## 2023-06-21 NOTE — ED Triage Notes (Signed)
Pt states he started yesterday with right lower dental pain and today its swollen. He has taken IBU

## 2023-06-21 NOTE — ED Provider Notes (Signed)
[facility]    CSN: [id number] Arrival date & time: 06/21/23  1005      History   Chief Complaint Chief Complaint  Patient presents with   Dental Pain    HPI Jonathan Gutierrez is a 47 y.o. male.   Patient presents for evaluation of right lower dental pain and facial swelling beginning 1 day ago.  No need for dental work but is not established as he recently moved to the area.  Has been taking ibuprofen and Orajel which have been minimally effective.  Denies fevers or drainage.    Past Medical History:  Diagnosis Date   HIV (human immunodeficiency virus infection) (HCC)    Stroke (HCC) 2016    Patient Active Problem List   Diagnosis Date Noted   Medication monitoring encounter 11/26/2022   Screening for STDs (sexually transmitted diseases) 11/26/2022   Viral warts 01/30/2022   Syphilis 01/30/2022   HIV disease (HCC) 01/30/2022   Need for immunization against influenza 01/22/2022   Need for pneumococcal vaccine 01/22/2022   AIDS (acquired immune deficiency syndrome) (HCC) 07/03/2021   Type 2 diabetes mellitus without complication, without long-term current use of insulin (HCC) 07/03/2021   Hyperlipidemia 07/03/2021   Chronic viral hepatitis B without delta agent and without coma (HCC) 07/03/2021   Smoking 07/03/2021   Immunization counseling 07/03/2021   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 No pertinent surgical history.     Home Medications    Prior to Admission medications   Medication Sig Start Date End Date Taking? Authorizing Provider  bictegravir-emtricitabine-tenofovir AF (BIKTARVY) 50-200-25 MG TABS tablet Take 1 tablet by mouth daily. 06/16/23  Yes Odette Fraction, MD  aspirin (ASPIRIN 81) 81 MG EC tablet Take 1 tablet (81 mg total) by mouth daily. Swallow whole. 08/23/21   Odette Fraction, MD  atorvastatin (LIPITOR) 40 MG tablet Take 1 tablet (40 mg total) by mouth daily. Patient not taking: Reported on  06/16/2023 11/25/22   Odette Fraction, MD  atovaquone [facility]) 750 MG/5ML suspension Take 10 mLs (1,500 mg total) by mouth daily with breakfast. Patient not taking: Reported on 06/16/2023 11/26/22   Odette Fraction, MD  cetirizine (ZYRTEC) 10 MG tablet Take 1 tablet (10 mg total) by mouth daily. Patient not taking: Reported on 06/16/2023 08/23/21   Odette Fraction, MD  cyclobenzaprine (FLEXERIL) 5 MG tablet Take 1 tablet (5 mg total) by mouth 3 (three) times daily as needed for muscle spasms. Patient not taking: Reported on 06/16/2023 03/13/23   Hermanns, Ashlee P, PA-C  imiquimod (ALDARA) 5 % cream Apply topically 3 (three) times a week. Apply once daily at bedtime, 3 times/week on non consecutive days for up to 16 weeks. Treatment area should be washed with soap and water 6-10 hrs after application Patient not taking: Reported on 06/16/2023 11/28/22   Jennette Kettle, RPH-CPP    Family History Family History  Family history unknown: Yes    Social History Social History   Tobacco Use   Smoking status: Former    Types: Cigarettes, Cigars    Quit date: 05/30/2019    Years since quitting: 4.0   Smokeless tobacco: Never   Tobacco comments:    Trying to quit, smoking 3 black and milds a day   Vaping Use   Vaping Use: Every day  Substance Use Topics   Alcohol use: Not Currently   Drug use: Yes    Types: Marijuana    Comment: daily     Allergies  Bactrim [sulfamethoxazole-trimethoprim]   Review of Systems Review of Systems   Physical Exam Triage Vital Signs ED Triage Vitals  Enc Vitals Group     BP 06/21/23 1037 (!) 158/102     Pulse Rate 06/21/23 1037 62     Resp 06/21/23 1037 18     Temp 06/21/23 1037 98.4 F (36.9 C)     Temp Source 06/21/23 1037 Oral     SpO2 06/21/23 1037 98 %     Weight --      Height --      Head Circumference --      Peak Flow --      Pain Score 06/21/23 1035 8     Pain Loc --      Pain Edu? --      Excl. in GC? --    No data  found.  Updated Vital Signs BP (!) 158/102 (BP Location: Right Arm)   Pulse 62   Temp 98.4 F (36.9 C) (Oral)   Resp 18   SpO2 98%   Visual Acuity Right Eye Distance:   Left Eye Distance:   Bilateral Distance:    Right Eye Near:   Left Eye Near:    Bilateral Near:     Physical Exam Constitutional:      Appearance: Normal appearance.  HENT:     Mouth/Throat:     Comments: Dental decay along the right lower gumline with mild to moderate gingival swelling, moderate swelling to the right lower jaw Neurological:     Mental Status: He is alert and oriented to person, place, and time.      UC Treatments / Results  Labs (all labs ordered are listed, but only abnormal results are displayed) Labs Reviewed - No data to display  EKG   Radiology No results found.  Procedures Procedures (including critical care time)  Medications Ordered in UC Medications - No data to display  Initial Impression / Assessment and Plan / UC Course  I have reviewed the triage vital signs and the nursing notes.  Pertinent labs & imaging results that were available during my care of the patient were reviewed by me and considered in my medical decision making (see chart for details).  Dental pain, right-sided facial  Consistent with infection, discussed with patient, Augmentin prescribed as well as Magic mouthwash for pain.  May continue use of over-the-counter analgesics for management of pain, recommend additional supportive care and given walker referral to local dentist office for reevaluation Final Clinical Impressions(s) / UC Diagnoses   Final diagnoses:  None   Discharge Instructions   None    ED Prescriptions   None    PDMP not reviewed this encounter.   Valinda Hoar, NP 06/21/23 1053

## 2023-06-23 ENCOUNTER — Encounter: Payer: Self-pay | Admitting: Nurse Practitioner

## 2023-06-23 ENCOUNTER — Ambulatory Visit (INDEPENDENT_AMBULATORY_CARE_PROVIDER_SITE_OTHER): Payer: PRIVATE HEALTH INSURANCE | Admitting: Nurse Practitioner

## 2023-06-23 VITALS — BP 122/79 | HR 73 | Temp 98.9°F | Ht 71.0 in | Wt 157.2 lb

## 2023-06-23 DIAGNOSIS — Z1211 Encounter for screening for malignant neoplasm of colon: Secondary | ICD-10-CM

## 2023-06-23 DIAGNOSIS — Z8673 Personal history of transient ischemic attack (TIA), and cerebral infarction without residual deficits: Secondary | ICD-10-CM | POA: Insufficient documentation

## 2023-06-23 DIAGNOSIS — E119 Type 2 diabetes mellitus without complications: Secondary | ICD-10-CM

## 2023-06-23 DIAGNOSIS — E785 Hyperlipidemia, unspecified: Secondary | ICD-10-CM

## 2023-06-23 DIAGNOSIS — B20 Human immunodeficiency virus [HIV] disease: Secondary | ICD-10-CM

## 2023-06-23 DIAGNOSIS — F1290 Cannabis use, unspecified, uncomplicated: Secondary | ICD-10-CM | POA: Insufficient documentation

## 2023-06-23 LAB — POCT GLYCOSYLATED HEMOGLOBIN (HGB A1C): HbA1c, POC (controlled diabetic range): 6.4 % (ref 0.0–7.0)

## 2023-06-23 MED ORDER — ASPIRIN 81 MG PO TBEC
81.0000 mg | DELAYED_RELEASE_TABLET | Freq: Every day | ORAL | 0 refills | Status: AC
Start: 2023-06-23 — End: ?

## 2023-06-23 MED ORDER — ATORVASTATIN CALCIUM 40 MG PO TABS
40.0000 mg | ORAL_TABLET | Freq: Every day | ORAL | 1 refills | Status: AC
Start: 2023-06-23 — End: ?

## 2023-06-23 NOTE — Assessment & Plan Note (Addendum)
LDL goal is less than 55 Restart atorvastatin 40 mg daily Encouraged to take aspirin 81 mg daily Follow-up with 2 months Lab Results  Component Value Date   CHOL 176 06/16/2023   HDL 49 06/16/2023   LDLCALC 113 (H) 06/16/2023   TRIG 57 06/16/2023   CHOLHDL 3.6 06/16/2023

## 2023-06-23 NOTE — Progress Notes (Signed)
Discuss metformin  

## 2023-06-23 NOTE — Assessment & Plan Note (Signed)
Lab Results  Component Value Date   HGBA1C 6.4 06/23/2023  Currently diet controlled Patient counseled on low-carb modified diet Encouraged to engage in regular moderate to vigorous exercise at least 250 minutes weekly Urine creatinine labs ordered Diabetes foot exam completed Encouraged to get diabetic eye exam done Not on ACE or ARB but his blood pressure is well-controlled BP Readings from Last 3 Encounters:  06/23/23 122/79  06/21/23 (!) 158/102  06/16/23 133/78

## 2023-06-23 NOTE — Assessment & Plan Note (Signed)
Smokes marijuana daily ,need to avoid smoking marijuana discussed

## 2023-06-23 NOTE — Assessment & Plan Note (Signed)
Continue Biktarvy and maintain close follow-up with infectious disease specialist

## 2023-06-23 NOTE — Assessment & Plan Note (Addendum)
Lab Results  Component Value Date   CHOL 176 06/16/2023   HDL 49 06/16/2023   LDLCALC 113 (H) 06/16/2023   TRIG 57 06/16/2023   CHOLHDL 3.6 06/16/2023   LDL goal is less than 55 Restart atorvastatin 40 mg daily Follow-up with 2 months

## 2023-06-23 NOTE — Progress Notes (Signed)
New Patient Office Visit  Subjective:  Patient ID: Jonathan Gutierrez, male    DOB: [birth year]  Age: 47 y.o. MRN: [id number]  CC:  Chief Complaint  Patient presents with   New Patient (Initial Visit)    HPI Jonathan Gutierrez is a 47 y.o. male  has a past medical history of AIDS (acquired immune deficiency syndrome) (HCC), Chronic hepatitis B (HCC), Diabetes mellitus without complication (HCC), HIV (human immunodeficiency virus infection) (HCC), HPV (human papilloma virus) infection, Hyperlipidemia, Stroke (HCC) (2016), Syphilis, and Viral warts.  Patient presents to establish care for his chronic medical conditions.  Stated that he moved from [place] in 2022.   HIV AIDS. he is being followed by the infectious disease specialist   Takes Biktarvy.  He denies fever, chills, malaise  Type 2 diabetes.  Was previously on metformin and atorvastatin.  Stated that he has not been on both medications for about 2 years.  No complaints of polyphagia, polyuria, polydipsia  Has history of CVA/hyperlipidemia.  Stated that he had a stroke in 2016.  No recurrence since his last stroke.  not taking aspirin or atorvastatin.   Due for colon cancer screening Cologuard test ordered     Past Medical History:  Diagnosis Date   AIDS (acquired immune deficiency syndrome) (HCC)    Chronic hepatitis B (HCC)    Diabetes mellitus without complication (HCC)    HIV (human immunodeficiency virus infection) (HCC)    HPV (human papilloma virus) infection    Hyperlipidemia    Stroke (HCC) 2016   Syphilis    Viral warts     History reviewed. No pertinent surgical history.  Family History  Problem Relation Age of Onset   Diabetes Father    Breast cancer Maternal Aunt     Social History   Socioeconomic History   Marital status: Single    Spouse name: Not on file   Number of children: Not on file   Years of education: Not on file   Highest education level: Not on file  Occupational History   Not on file   Tobacco Use   Smoking status: Former    Types: Cigarettes, Cigars    Quit date: 05/30/2019    Years since quitting: 4.0   Smokeless tobacco: Never   Tobacco comments:    Trying to quit, smoking 3 black and milds a day   Vaping Use   Vaping Use: Every day  Substance and Sexual Activity   Alcohol use: Not Currently   Drug use: Yes    Types: Marijuana    Comment: daily   Sexual activity: Not Currently    Partners: Male, Male    Comment: declined condoms 07/2021  Other Topics Concern   Not on file  Social History Narrative   Lives with his aunt   Social Determinants of Health   Financial Resource Strain: Not on file  Food Insecurity: Not on file  Transportation Needs: Not on file  Physical Activity: Not on file  Stress: Not on file  Social Connections: Not on file  Intimate Partner Violence: Not on file    ROS Review of Systems  Constitutional:  Negative for activity change, appetite change, chills, diaphoresis, fatigue, fever and unexpected weight change.  HENT:  Negative for congestion, dental problem, drooling and ear discharge.   Eyes:  Negative for pain, discharge, redness and itching.  Respiratory:  Negative for apnea, cough, choking, chest tightness, shortness of breath and wheezing.   Cardiovascular: Negative.  Negative for  chest pain, palpitations and leg swelling.  Gastrointestinal:  Negative for abdominal distention, abdominal pain, anal bleeding, blood in stool, constipation, diarrhea and vomiting.  Endocrine: Negative for polydipsia, polyphagia and polyuria.  Genitourinary:  Negative for difficulty urinating, flank pain, frequency and genital sores.  Musculoskeletal: Negative.  Negative for arthralgias, back pain, gait problem and joint swelling.  Skin:  Negative for color change, pallor and rash.  Neurological:  Negative for dizziness, facial asymmetry, light-headedness, numbness and headaches.  Psychiatric/Behavioral:  Negative for agitation, behavioral  problems, confusion, hallucinations, self-injury, sleep disturbance and suicidal ideas.     Objective:   Today's Vitals: BP 122/79   Pulse 73   Temp 98.9 F (37.2 C) (Oral)   Ht 5\' 11"  (1.803 m)   Wt 157 lb 3.2 oz (71.3 kg)   SpO2 99%   BMI 21.92 kg/m   Physical Exam Vitals and nursing note reviewed.  Constitutional:      General: He is not in acute distress.    Appearance: Normal appearance. He is not ill-appearing, toxic-appearing or diaphoretic.  HENT:     Mouth/Throat:     Mouth: Mucous membranes are moist.     Pharynx: Oropharynx is clear. No oropharyngeal exudate or posterior oropharyngeal erythema.  Eyes:     General: No scleral icterus.       Right eye: No discharge.        Left eye: No discharge.     Extraocular Movements: Extraocular movements intact.     Conjunctiva/sclera: Conjunctivae normal.  Cardiovascular:     Rate and Rhythm: Normal rate and regular rhythm.     Pulses: Normal pulses.     Heart sounds: Normal heart sounds. No murmur heard.    No friction rub. No gallop.  Pulmonary:     Effort: Pulmonary effort is normal. No respiratory distress.     Breath sounds: Normal breath sounds. No stridor. No wheezing, rhonchi or rales.  Chest:     Chest wall: No tenderness.  Abdominal:     General: There is no distension.     Palpations: Abdomen is soft.     Tenderness: There is no abdominal tenderness. There is no right CVA tenderness, left CVA tenderness or guarding.  Musculoskeletal:        General: No swelling, tenderness, deformity or signs of injury.     Right lower leg: No edema.     Left lower leg: No edema.  Skin:    General: Skin is warm and dry.     Capillary Refill: Capillary refill takes less than 2 seconds.     Coloration: Skin is not jaundiced or pale.     Findings: No erythema or lesion.  Neurological:     Mental Status: He is alert and oriented to person, place, and time.     Sensory: No sensory deficit.     Motor: No weakness.      Coordination: Coordination normal.  Psychiatric:        Mood and Affect: Mood normal.        Behavior: Behavior normal.        Thought Content: Thought content normal.        Judgment: Judgment normal.     Assessment & Plan:   Problem List Items Addressed This Visit       Endocrine   Type 2 diabetes mellitus without complication, without long-term current use of insulin (HCC) - Primary    Lab Results  Component Value Date   HGBA1C 6.4  06/23/2023  Currently diet controlled Patient counseled on low-carb modified diet Encouraged to engage in regular moderate to vigorous exercise at least 250 minutes weekly Urine creatinine labs ordered Diabetes foot exam completed Encouraged to get diabetic eye exam done Not on ACE or ARB but his blood pressure is well-controlled BP Readings from Last 3 Encounters:  06/23/23 122/79  06/21/23 (!) 158/102  06/16/23 133/78         Relevant Medications   atorvastatin (LIPITOR) 40 MG tablet   aspirin EC (ASPIRIN 81) 81 MG tablet   Other Relevant Orders   Microalbumin / creatinine urine ratio   POCT glycosylated hemoglobin (Hb A1C) (Completed)     Other   AIDS (acquired immune deficiency syndrome) (HCC)    Continue Biktarvy and maintain close follow-up with infectious disease specialist      Hyperlipidemia    Lab Results  Component Value Date   CHOL 176 06/16/2023   HDL 49 06/16/2023   LDLCALC 113 (H) 06/16/2023   TRIG 57 06/16/2023   CHOLHDL 3.6 06/16/2023   LDL goal is less than 55 Restart atorvastatin 40 mg daily Follow-up with 2 months      Relevant Medications   atorvastatin (LIPITOR) 40 MG tablet   aspirin EC (ASPIRIN 81) 81 MG tablet   Marijuana smoker    Smokes marijuana daily ,need to avoid smoking marijuana discussed      History of stroke    LDL goal is less than 55 Restart atorvastatin 40 mg daily Encouraged to take aspirin 81 mg daily Follow-up with 2 months Lab Results  Component Value Date   CHOL 176  06/16/2023   HDL 49 06/16/2023   LDLCALC 113 (H) 06/16/2023   TRIG 57 06/16/2023   CHOLHDL 3.6 06/16/2023         Relevant Medications   atorvastatin (LIPITOR) 40 MG tablet   aspirin EC (ASPIRIN 81) 81 MG tablet   Other Visit Diagnoses     Screening for colon cancer       Relevant Orders   Cologuard       Outpatient Encounter Medications as of 06/23/2023  Medication Sig   amoxicillin-clavulanate (AUGMENTIN) 875-125 MG tablet Take 1 tablet by mouth every 12 (twelve) hours.   bictegravir-emtricitabine-tenofovir AF (BIKTARVY) 50-200-25 MG TABS tablet Take 1 tablet by mouth daily.   aspirin EC (ASPIRIN 81) 81 MG tablet Take 1 tablet (81 mg total) by mouth daily. Swallow whole.   atorvastatin (LIPITOR) 40 MG tablet Take 1 tablet (40 mg total) by mouth daily.   atovaquone (MEPRON) 750 MG/5ML suspension Take 10 mLs (1,500 mg total) by mouth daily with breakfast. (Patient not taking: Reported on 06/16/2023)   cetirizine (ZYRTEC) 10 MG tablet Take 1 tablet (10 mg total) by mouth daily. (Patient not taking: Reported on 06/16/2023)   cyclobenzaprine (FLEXERIL) 5 MG tablet Take 1 tablet (5 mg total) by mouth 3 (three) times daily as needed for muscle spasms. (Patient not taking: Reported on 06/16/2023)   imiquimod (ALDARA) 5 % cream Apply topically 3 (three) times a week. Apply once daily at bedtime, 3 times/week on non consecutive days for up to 16 weeks. Treatment area should be washed with soap and water 6-10 hrs after application (Patient not taking: Reported on 06/16/2023)   magic mouthwash (nystatin, lidocaine, diphenhydrAMINE, alum & mag hydroxide) suspension Swish and swallow 5 mLs 4 (four) times daily as needed for mouth pain. (Patient not taking: Reported on 06/23/2023)   [DISCONTINUED] aspirin (ASPIRIN 81) 81 MG EC  tablet Take 1 tablet (81 mg total) by mouth daily. Swallow whole. (Patient not taking: Reported on 06/23/2023)   [DISCONTINUED] atorvastatin (LIPITOR) 40 MG tablet Take 1 tablet  (40 mg total) by mouth daily. (Patient not taking: Reported on 06/16/2023)   No facility-administered encounter medications on file as of 06/23/2023.    Follow-up: Return in about 2 months (around 08/23/2023) for HYPERLIPIDEMIA.   Donell Beers, FNP

## 2023-06-23 NOTE — Patient Instructions (Signed)
Hyperlipidemia, unspecified hyperlipidemia type  - atorvastatin (LIPITOR) 40 MG tablet; Take 1 tablet (40 mg total) by mouth daily.  Dispense: 90 tablet; Refill: 1    History of stroke  - atorvastatin (LIPITOR) 40 MG tablet; Take 1 tablet (40 mg total) by mouth daily.  Dispense: 90 tablet; Refill: 1    It is important that you exercise regularly at least 30 minutes 5 times a week as tolerated  Think about what you will eat, plan ahead. Choose " clean, green, fresh or frozen" over canned, processed or packaged foods which are more sugary, salty and fatty. 70 to 75% of food eaten should be vegetables and fruit. Three meals at set times with snacks allowed between meals, but they must be fruit or vegetables. Aim to eat over a 12 hour period , example 7 am to 7 pm, and STOP after  your last meal of the day. Drink water,generally about 64 ounces per day, no other drink is as healthy. Fruit juice is best enjoyed in a healthy way, by EATING the fruit.  Thanks for choosing [facility] we consider it a privelige to serve you.

## 2023-06-24 LAB — MICROALBUMIN / CREATININE URINE RATIO
Creatinine, Urine: 204.2 mg/dL
Microalb/Creat Ratio: 20 mg/g creat (ref 0–29)
Microalbumin, Urine: 41.1 ug/mL

## 2023-07-12 ENCOUNTER — Other Ambulatory Visit: Payer: Self-pay | Admitting: Infectious Diseases

## 2023-07-16 LAB — COLOGUARD: COLOGUARD: NEGATIVE

## 2023-08-24 ENCOUNTER — Ambulatory Visit: Payer: Self-pay | Admitting: Nurse Practitioner

## 2023-11-23 ENCOUNTER — Other Ambulatory Visit: Payer: Self-pay | Admitting: Infectious Diseases

## 2023-11-23 DIAGNOSIS — B181 Chronic viral hepatitis B without delta-agent: Secondary | ICD-10-CM

## 2023-12-17 ENCOUNTER — Ambulatory Visit: Payer: PRIVATE HEALTH INSURANCE | Admitting: Infectious Diseases

## 2023-12-17 NOTE — Progress Notes (Deleted)
[address], [place], [place], [address]                                                                  Phn. [phone]; Fax: [phone]                                                                             Date: 06/16/23 Reason for Visit: Regular HIV fu    HPI: Jonathan Gutierrez is a 47 y.o.old male with a history of HIV.  Last seen on 06/24/22, Tolerating Biktarvy well without any issues.  Lab Results  Component Value Date   HIV1RNAQUANT NOT DETECTED 06/16/2023   Lab Results  Component Value Date   CD4TABS 220 (L) 06/16/2023   CD4TABS 201 (L) 11/25/2022   CD4TABS 172 (L) 06/24/2022     Interval H/o Compliant with Biktarvy but has not been able to get atovaquone due to unclear reasons. Was seen in the ED in 3/15 for thoracic back spasm and was discharged on cyclobenzaprine. Has an upcoming appt with PCP. Smokes weed. Denies alcohol and IVDU. Denies being sexually active with no plans in future as well. Works in [employer].   He was unable to see dermatology as well as Urology due to no insurance and has been applying aldara cream for penile wart and reports its shrinking. He wants to get a new Derm referral as he has insurance now. He would like to get covid vaccine if due as he is exposed to a lot of people at work. Doing well with no complaints otherwise.   ROS: As stated in above HPI; all other systems were reviewed and are otherwise negative unless noted below  No reported fever / chills, night sweats, unintentional weight loss, acute visual change, odynophagia, chest pain/pressure, new or worsened SOB or WOB, nausea, vomiting, diarrhea, dysuria, GU discharge, syncope, seizures, red/hot swollen joints, hallucinations / delusions, rashes, new allergies, unusual / excessive  bleeding, swollen lymph nodes.  PMH/ PSH/ FamHx / Social Hx , medications and allergies reviewed and updated as appropriate; please see corresponding tab in EHR / prior notes                                        Current Outpatient Medications on File Prior to Visit  Medication Sig Dispense Refill   amoxicillin-clavulanate (AUGMENTIN) 875-125 MG tablet Take 1 tablet by mouth every 12 (twelve) hours. 14 tablet 0   aspirin EC (ASPIRIN 81) 81 MG tablet Take 1 tablet (81 mg total) by mouth daily. Swallow  whole. 30 tablet 0   atorvastatin (LIPITOR) 40 MG tablet Take 1 tablet (40 mg total) by mouth daily. 90 tablet 1   atovaquone (MEPRON) 750 MG/5ML suspension Take 10 mLs (1,500 mg total) by mouth daily with breakfast. (Patient not taking: Reported on 06/16/2023) 210 mL 5   bictegravir-emtricitabine-tenofovir AF (BIKTARVY) 50-200-25 MG TABS tablet Take 1 tablet by mouth daily. 30 tablet 11   cetirizine (ZYRTEC) 10 MG tablet Take 1 tablet (10 mg total) by mouth daily. (Patient not taking: Reported on 06/16/2023) 30 tablet 0   cyclobenzaprine (FLEXERIL) 5 MG tablet Take 1 tablet (5 mg total) by mouth 3 (three) times daily as needed for muscle spasms. (Patient not taking: Reported on 06/16/2023) 15 tablet 0   imiquimod (ALDARA) 5 % cream Apply topically 3 (three) times a week. Apply once daily at bedtime, 3 times/week on non consecutive days for up to 16 weeks. Treatment area should be washed with soap and water 6-10 hrs after application (Patient not taking: Reported on 06/16/2023) 12 each 5   magic mouthwash (nystatin, lidocaine, diphenhydrAMINE, alum & mag hydroxide) suspension Swish and swallow 5 mLs 4 (four) times daily as needed for mouth pain. (Patient not taking: Reported on 06/23/2023) 180 mL 0   No current facility-administered medications on file prior to visit.    Allergies  Allergen Reactions   Bactrim [Sulfamethoxazole-Trimethoprim]    Social History   Socioeconomic History   Marital  status: Single    Spouse name: Not on file   Number of children: Not on file   Years of education: Not on file   Highest education level: Not on file  Occupational History   Not on file  Tobacco Use   Smoking status: Former    Current packs/day: 0.00    Types: Cigarettes, Cigars    Quit date: 05/30/2019    Years since quitting: 4.5   Smokeless tobacco: Never   Tobacco comments:    Trying to quit, smoking 3 black and milds a day   Vaping Use   Vaping status: Every Day  Substance and Sexual Activity   Alcohol use: Not Currently   Drug use: Yes    Types: Marijuana    Comment: daily   Sexual activity: Not Currently    Partners: Male, Male    Comment: declined condoms 07/2021  Other Topics Concern   Not on file  Social History Narrative   Lives with his aunt   Social Drivers of Corporate investment banker Strain: Not on File (05/06/2022)   Received from [facility], [facility]    Financial Resource Strain: 0  Food Insecurity: Not on File (09/24/2023)   Received from [facility] Insecurity    Food: 0  Transportation Needs: Not on File (05/06/2022)   Received from [place], [facility] Needs    Transportation: 0  Physical Activity: Not on File (05/06/2022)   Received from [place], [place]   Physical Activity    Physical Activity: 0  Stress: Not on File (05/06/2022)   Received from [facility], [place]   Stress    Stress: 0  Social Connections: Not on File (09/12/2023)   Received from [facility]   Social Connections    Connectedness: 0  Intimate Partner Violence: Not on file   Family History  Problem Relation Age of Onset   Diabetes Father    Breast cancer Maternal Aunt     Vitals  There were no vitals taken for this  visit.   Examination  Gen: Awake, alert and oriented x 3, no acute distress HEENT: [place]/AT, no scleral icterus, no pale conjunctivae, hearing normal, oral mucosa moist Neck: Supple Cardio: Regular rate and rhythm Resp: CTAB GI:  nondistended GU: Penile wart in the shaft appears to be smaller than last seen approx  1 to 1.5 cm in length and breadth. No signs of infection or discharge, Exam with chaperone Extremities: No pedal edema  Neuro: grossly non focal  Psych: Calm, cooperative  Lab Results HIV 1 RNA Quant (copies/mL)  Date Value  06/16/2023 NOT DETECTED  11/25/2022 NOT DETECTED  06/24/2022 NOT DETECTED   CD4 T Cell Abs (/uL)  Date Value  06/16/2023 220 (L)  11/25/2022 201 (L)  06/24/2022 172 (L)   No results found for: "HIV1GENOSEQ" Lab Results  Component Value Date   WBC 5.3 06/16/2023   HGB 15.0 06/16/2023   HCT 44.6 06/16/2023   MCV 86.9 06/16/2023   PLT 205 06/16/2023    Lab Results  Component Value Date   CREATININE 1.20 06/16/2023   BUN 17 06/16/2023   NA 140 06/16/2023   K 4.1 06/16/2023   CL 107 06/16/2023   CO2 30 06/16/2023   Lab Results  Component Value Date   ALT 16 06/16/2023   AST 17 06/16/2023   BILITOT 0.3 06/16/2023    Lab Results  Component Value Date   CHOL 176 06/16/2023   TRIG 57 06/16/2023   HDL 49 06/16/2023   LDLCALC 113 (H) 06/16/2023   Lab Results  Component Value Date   HAV NON-REACTIVE 05/20/2021   Lab Results  Component Value Date   HEPBSAG REACTIVE (A) 06/16/2023   HEPBSAB NON-REACTIVE 11/25/2022   No results found for: "HCVAB" Lab Results  Component Value Date   CHLAMYDIAWP Negative 06/16/2023   N Negative 06/16/2023   No results found for: "GCPROBEAPT" No results found for: "QUANTGOLD"   Health Maintenance: Immunization History  Administered Date(s) Administered   HPV 9-valent 10/17/2020, 11/19/2020, 04/22/2021   Hepatitis A, Adult 07/06/2020, 04/22/2021   Influenza,inj,Quad PF,6+ Mos 10/13/2019, 02/11/2021, 01/22/2022, 11/25/2022   MenQuadfi_Meningococcal Groups ACYW Conjugate 02/11/2021   Meningococcal Conjugate 10/17/2020   PFIZER(Purple Top)SARS-COV-2 Vaccination 05/22/2020, 06/12/2020, 12/17/2020   PNEUMOCOCCAL  CONJUGATE-20 01/22/2022   Pfizer(Comirnaty)Fall Seasonal Vaccine 12 years and older 11/25/2022   Pneumococcal Conjugate-13 12/26/2019   Tdap 07/06/2020    Assessment/Plan: # HIV,/AIDs, well controlled  - Continue Biktarvy  - Labs today  - Fu in 6 months    # STD screening - Urine GC and RPR - No acute concerns   # Penile wart  - continue aldara cream  - New referral to dermatology   # Chronic Hepatitis B  - Continue Biktarvy  - Labs for Hep B  #  H/o Cardiomyoapthy with use of life vest in the past/DM/Hx pf stroke/hyperlipidemia  - He is only on aspirin, neither taking atorvastatin or meds for DM  - he has an upcoming appt with PCP and instructed not to miss it  # Immunization  Hepatitis A 07/06/2020, 04/02/2021 HPV 9 07/06/2020, 04/02/2021 COVID - 05/12/2020, 06/12/2020, 12/17/20, 11/25/2022, not due for covid vaccine booster  ( less than 65 years, HIV well controlled wirh CD4 last more than 200) Influenza - 11/25/22 Monkeypox - due Pneumococcal - PCV 13, 12/26/19 PCV 20 01/22/22 Meningococcal - 10,20/2021, 02/11/2021 HepA - has received 2 doses in 2021 and 2022  Tdap - 2021  Shingles   # Health Maintenance -he wants to do  anal pap later  Patient's labs were reviewed as well as his previous records. Patients questions were addressed and answered. Safe sex counseling done.  I have personally spent 40 minutes involved in face-to-face and non-face-to-face activities for this patient on the day of the visit.   Electronically signed by:  Odette Fraction, MD Infectious Disease Physician [facility] [address] [place], [place] [address] Phone: [phone]  Fax: [phone]

## 2024-01-11 ENCOUNTER — Telehealth: Payer: Self-pay

## 2024-01-11 NOTE — Telephone Encounter (Signed)
 Called pt left message advising time to schedule an appt and to fill out financial application, to call back at [phone]---deanna mabe/rcid.

## 2024-01-15 ENCOUNTER — Ambulatory Visit (INDEPENDENT_AMBULATORY_CARE_PROVIDER_SITE_OTHER): Payer: PRIVATE HEALTH INSURANCE

## 2024-01-15 ENCOUNTER — Encounter: Payer: Self-pay | Admitting: Urgent Care

## 2024-01-15 ENCOUNTER — Ambulatory Visit
Admission: EM | Admit: 2024-01-15 | Discharge: 2024-01-15 | Disposition: A | Discharge status: Home / Self Care | Payer: PRIVATE HEALTH INSURANCE | Attending: Family Medicine | Admitting: Family Medicine

## 2024-01-15 DIAGNOSIS — M545 Low back pain, unspecified: Secondary | ICD-10-CM

## 2024-01-15 DIAGNOSIS — S39012A Strain of muscle, fascia and tendon of lower back, initial encounter: Secondary | ICD-10-CM | POA: Diagnosis not present

## 2024-01-15 MED ORDER — CYCLOBENZAPRINE HCL 5 MG PO TABS: 5.0000 mg | ORAL_TABLET | Freq: Every evening | ORAL | 0 refills | Status: AC | PRN

## 2024-01-15 MED ORDER — ACETAMINOPHEN 325 MG PO TABS: 650.0000 mg | ORAL_TABLET | Freq: Four times a day (QID) | ORAL | 0 refills | Status: AC | PRN

## 2024-01-15 NOTE — ED Triage Notes (Signed)
Pt reports low back pain, after he turned his torso to the side when he was at work 3 hrs ago.

## 2024-01-15 NOTE — ED Provider Notes (Signed)
[facility]  Note:  This document was prepared using Conservation officer, historic buildings and may include unintentional dictation errors.  MRN: [id number] DOB: [birth year]  Subjective:   Jonathan Gutierrez is a 48 y.o. male presenting for 3-day history of acute onset persistent low back pain after he rotated his torso when he was at work.  It was not a particular work injury but he has continued to feel his low back pain today. No fall, trauma, numbness or tingling, saddle paresthesia, changes to bowel or urinary habits, radicular symptoms.  Takes Biktarvy for his HIV.  No current facility-administered medications for this encounter.  Current Outpatient Medications:    amoxicillin-clavulanate (AUGMENTIN) 875-125 MG tablet, Take 1 tablet by mouth every 12 (twelve) hours., Disp: 14 tablet, Rfl: 0   aspirin EC (ASPIRIN 81) 81 MG tablet, Take 1 tablet (81 mg total) by mouth daily. Swallow whole., Disp: 30 tablet, Rfl: 0   atorvastatin (LIPITOR) 40 MG tablet, Take 1 tablet (40 mg total) by mouth daily., Disp: 90 tablet, Rfl: 1   atovaquone (MEPRON) 750 MG/5ML suspension, Take 10 mLs (1,500 mg total) by mouth daily with breakfast. (Patient not taking: Reported on 06/16/2023), Disp: 210 mL, Rfl: 5   bictegravir-emtricitabine-tenofovir AF (BIKTARVY) 50-200-25 MG TABS tablet, Take 1 tablet by mouth daily., Disp: 30 tablet, Rfl: 11   cetirizine (ZYRTEC) 10 MG tablet, Take 1 tablet (10 mg total) by mouth daily. (Patient not taking: Reported on 06/16/2023), Disp: 30 tablet, Rfl: 0   cyclobenzaprine (FLEXERIL) 5 MG tablet, Take 1 tablet (5 mg total) by mouth 3 (three) times daily as needed for muscle spasms. (Patient not taking: Reported on 06/16/2023), Disp: 15 tablet, Rfl: 0   imiquimod (ALDARA) 5 % cream, Apply topically 3 (three) times a week. Apply once daily at bedtime, 3 times/week on non consecutive days for up to 16 weeks. Treatment area should be washed with soap and water 6-10 hrs  after application (Patient not taking: Reported on 06/16/2023), Disp: 12 each, Rfl: 5   magic mouthwash (nystatin, lidocaine, diphenhydrAMINE, alum & mag hydroxide) suspension, Swish and swallow 5 mLs 4 (four) times daily as needed for mouth pain. (Patient not taking: Reported on 06/23/2023), Disp: 180 mL, Rfl: 0   Allergies  Allergen Reactions   Bactrim [Sulfamethoxazole-Trimethoprim]     Past Medical History:  Diagnosis Date   AIDS (acquired immune deficiency syndrome) (HCC)    Chronic hepatitis B (HCC)    Diabetes mellitus without complication (HCC)    HIV (human immunodeficiency virus infection) (HCC)    HPV (human papilloma virus) infection    Hyperlipidemia    Stroke (HCC) 2016   Syphilis    Viral warts      History reviewed. No pertinent surgical history.  Family History  Problem Relation Age of Onset   Diabetes Father    Breast cancer Maternal Aunt     Social History   Tobacco Use   Smoking status: Former    Current packs/day: 0.00    Types: Cigarettes, Cigars    Quit date: 05/30/2019    Years since quitting: 4.6   Smokeless tobacco: Never   Tobacco comments:    Trying to quit, smoking 3 black and milds a day   Vaping Use   Vaping status: Every Day  Substance Use Topics   Alcohol use: Not Currently   Drug use: Yes    Types: Marijuana    Comment: daily    ROS   Objective:  Vitals: BP 131/82 (BP Location: Right Arm)   Pulse 74   Temp 98.8 F (37.1 C) (Oral)   Resp 16   SpO2 96%   Physical Exam Constitutional:      General: He is not in acute distress.    Appearance: Normal appearance. He is well-developed and normal weight. He is not ill-appearing, toxic-appearing or diaphoretic.  HENT:     Head: Normocephalic and atraumatic.     Right Ear: External ear normal.     Left Ear: External ear normal.     Nose: Nose normal.     Mouth/Throat:     Pharynx: Oropharynx is clear.  Eyes:     General: No scleral icterus.       Right eye: No discharge.         Left eye: No discharge.     Extraocular Movements: Extraocular movements intact.  Cardiovascular:     Rate and Rhythm: Normal rate.  Pulmonary:     Effort: Pulmonary effort is normal.  Musculoskeletal:     Cervical back: Normal range of motion.     Lumbar back: Spasms and tenderness (midline) present. No swelling, edema, deformity, signs of trauma, lacerations or bony tenderness. Normal range of motion. Negative right straight leg raise test and negative left straight leg raise test. No scoliosis.       Back:  Neurological:     Mental Status: He is alert and oriented to person, place, and time.  Psychiatric:        Mood and Affect: Mood normal.        Behavior: Behavior normal.        Thought Content: Thought content normal.        Judgment: Judgment normal.    DG Lumbar Spine 2-3 Views Result Date: 01/15/2024 CLINICAL DATA:  Low back pain after twisting injury EXAM: LUMBAR SPINE - 2-3 VIEW COMPARISON:  None Available. FINDINGS: Mild thoracolumbar curvature convex to the left and lower lumbar curvature convex to the right. No listhesis. No sign of degenerative disc space narrowing. No evidence of regional fracture. Mild lower lumbar facet osteoarthritis probably present. IMPRESSION: No acute or traumatic finding. Mild scoliosis. Mild lower lumbar facet osteoarthritis. Electronically Signed   By: Paulina Fusi M.D.   On: 01/15/2024 15:37    Assessment and Plan :   PDMP not reviewed this encounter.  1. Acute midline low back pain without sciatica   2. Lumbar strain, initial encounter    Will manage conservatively for back strain with Tylenol and muscle relaxant, rest and modification of physical activity.  Anticipatory guidance provided.  Counseled patient on potential for adverse effects with medications prescribed/recommended today, ER and return-to-clinic precautions discussed, patient verbalized understanding.    Wallis Bamberg, PA-C 01/15/24 1655

## 2024-01-29 ENCOUNTER — Other Ambulatory Visit (HOSPITAL_COMMUNITY): Payer: Self-pay

## 2024-01-29 ENCOUNTER — Other Ambulatory Visit: Payer: Self-pay | Admitting: Pharmacist

## 2024-01-29 MED ORDER — BICTEGRAVIR-EMTRICITAB-TENOFOV 50-200-25 MG PO TABS: 1.0000 | ORAL_TABLET | Freq: Every day | ORAL | Status: AC

## 2024-01-29 NOTE — Progress Notes (Signed)
Medication Samples have been provided to the patient.  Drug name: Biktarvy        Strength: 50/200/25 mg       Qty: 7 tablets (1 bottles) LOT: CSCFVA   Exp.Date: 10/26  Dosing instructions: Take one tablet by mouth once daily  The patient has been instructed regarding the correct time, dose, and frequency of taking this medication, including desired effects and most common side effects.   Margarite Gouge, PharmD, CPP, BCIDP, AAHIVP Clinical Pharmacist Practitioner Infectious Diseases Clinical Pharmacist [facility]

## 2024-02-09 ENCOUNTER — Other Ambulatory Visit: Payer: Self-pay

## 2024-02-09 ENCOUNTER — Ambulatory Visit: Payer: PRIVATE HEALTH INSURANCE | Admitting: Infectious Diseases

## 2024-02-09 ENCOUNTER — Encounter: Payer: Self-pay | Admitting: Infectious Diseases

## 2024-02-09 VITALS — BP 130/89 | HR 85 | Resp 16 | Ht 71.0 in | Wt 168.0 lb

## 2024-02-09 DIAGNOSIS — Z23 Encounter for immunization: Secondary | ICD-10-CM | POA: Diagnosis not present

## 2024-02-09 DIAGNOSIS — Z0000 Encounter for general adult medical examination without abnormal findings: Secondary | ICD-10-CM | POA: Insufficient documentation

## 2024-02-09 DIAGNOSIS — B20 Human immunodeficiency virus [HIV] disease: Secondary | ICD-10-CM

## 2024-02-09 DIAGNOSIS — B181 Chronic viral hepatitis B without delta-agent: Secondary | ICD-10-CM | POA: Diagnosis not present

## 2024-02-09 DIAGNOSIS — Z113 Encounter for screening for infections with a predominantly sexual mode of transmission: Secondary | ICD-10-CM

## 2024-02-09 DIAGNOSIS — Z5181 Encounter for therapeutic drug level monitoring: Secondary | ICD-10-CM

## 2024-02-09 DIAGNOSIS — Z7185 Encounter for immunization safety counseling: Secondary | ICD-10-CM

## 2024-02-09 NOTE — Progress Notes (Unsigned)
[address], [place], [place], [address]                                                                  Phn. [phone]; Fax: [phone]                                                                             Date: 06/16/23 Reason for Visit: Regular HIV fu    HPI: Jonathan Gutierrez is a 48 y.o.old male with a history of HIV.  Last seen on 06/24/22, Tolerating Biktarvy well without any issues.  Lab Results  Component Value Date   HIV1RNAQUANT NOT DETECTED 06/16/2023   Lab Results  Component Value Date   CD4TABS 220 (L) 06/16/2023   CD4TABS 201 (L) 11/25/2022   CD4TABS 172 (L) 06/24/2022     Interval H/o Compliant with Biktarvy but has not been able to get atovaquone due to unclear reasons. Was seen in the ED in 3/15 for thoracic back spasm and was discharged on cyclobenzaprine. Has an upcoming appt with PCP. Smokes weed. Denies alcohol and IVDU. Denies being sexually active with no plans in future as well. Works in [employer].   He was unable to see dermatology as well as Urology due to no insurance and has been applying aldara cream for penile wart and reports its shrinking. He wants to get a new Derm referral as he has insurance now. He would like to get covid vaccine if due as he is exposed to a lot of people at work. Doing well with no complaints otherwise.   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 Denies smoking, alcohol and IVDU. Has not seen dentist and discussed dental care. Looking for PT to increase strength of left side. Saw at UC few days ago for back pain that has resolved. Willing for anal pap.   The patient, with a history of HIV, presents for a routine follow-up. He reports adherence to his  antiretroviral therapy with Biktarvy, with no missed doses and no issues obtaining the medication. He recently had his ADAP renewed.  In addition to his HIV management, the patient has been started on aspirin and atorvastatin by his primary care provider for cardiovascular risk management. He has also completed a colon cancer screening, which returned negative results.  The patient has a dermatology appointment scheduled in April for a penile wart. He was referred by the current provider due to concerns about the wart.  The patient also expresses interest in physical therapy, particularly to address motor function issues on his left side,  which he attributes to a previous stroke. 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  The patient also reports a recent episode of back pain, for which he sought care at an urgent care center. The back pain has since improved. He is considering purchasing a brace to help with posture, as he feels he has not been sitting correctly since his stroke.  ROS: As stated in above HPI; all other systems were reviewed and are otherwise negative unless noted below  No reported fever / chills, night sweats, unintentional weight loss, acute visual change, odynophagia, chest pain/pressure, new or worsened SOB or WOB, nausea, vomiting, diarrhea, dysuria, GU discharge, syncope, seizures, red/hot swollen joints, hallucinations / delusions, rashes, new allergies, unusual / excessive bleeding, swollen lymph nodes.  PMH/ PSH/ FamHx / Social Hx , medications and allergies reviewed and updated as appropriate; please see corresponding tab in EHR / prior notes                                        Current Outpatient Medications on File Prior to Visit  Medication Sig Dispense Refill   acetaminophen (TYLENOL) 325 MG tablet Take 2 tablets (650 mg total) by mouth every 6 (six) hours as needed for moderate pain (pain  score 4-6). 30 tablet 0   amoxicillin-clavulanate (AUGMENTIN) 875-125 MG tablet Take 1 tablet by mouth every 12 (twelve) hours. 14 tablet 0   aspirin EC (ASPIRIN 81) 81 MG tablet Take 1 tablet (81 mg total) by mouth daily. Swallow whole. 30 tablet 0   atorvastatin (LIPITOR) 40 MG tablet Take 1 tablet (40 mg total) by mouth daily. 90 tablet 1   atovaquone (MEPRON) 750 MG/5ML suspension Take 10 mLs (1,500 mg total) by mouth daily with breakfast. (Patient not taking: Reported on 06/16/2023) 210 mL 5   bictegravir-emtricitabine-tenofovir AF (BIKTARVY) 50-200-25 MG TABS tablet Take 1 tablet by mouth daily. 30 tablet 11   cetirizine (ZYRTEC) 10 MG tablet Take 1 tablet (10 mg total) by mouth daily. (Patient not taking: Reported on 06/16/2023) 30 tablet 0   cyclobenzaprine (FLEXERIL) 5 MG tablet Take 1 tablet (5 mg total) by mouth at bedtime as needed. 30 tablet 0   imiquimod (ALDARA) 5 % cream Apply topically 3 (three) times a week. Apply once daily at bedtime, 3 times/week on non consecutive days for up to 16 weeks. Treatment area should be washed with soap and water 6-10 hrs after application (Patient not taking: Reported on 06/16/2023) 12 each 5   magic mouthwash (nystatin, lidocaine, diphenhydrAMINE, alum & mag hydroxide) suspension Swish and swallow 5 mLs 4 (four) times daily as needed for mouth pain. (Patient not taking: Reported on 06/23/2023) 180 mL 0   No current facility-administered medications on file prior to visit.    Allergies  Allergen Reactions   Bactrim [Sulfamethoxazole-Trimethoprim]    Social History   Socioeconomic History   Marital status: Single    Spouse name: Not on file   Number of children: Not on file   Years of education: Not on file   Highest education level: Not on file  Occupational History   Not on file  Tobacco Use   Smoking status: Former    Current packs/day: 0.00    Types: Cigarettes, Cigars    Quit date: 05/30/2019    Years since quitting: 4.7  Smokeless  tobacco: Never   Tobacco comments:    Trying to quit, smoking 3 black and milds a day   Vaping Use   Vaping status: Every Day  Substance and Sexual Activity   Alcohol use: Not Currently   Drug use: Yes    Types: Marijuana    Comment: daily   Sexual activity: Not Currently    Partners: Male, Male    Comment: declined condoms 07/2021  Other Topics Concern   Not on file  Social History Narrative   Lives with his aunt   Social Drivers of Corporate investment banker Strain: Not on File (05/06/2022)   Received from [facility], [facility]    Financial Resource Strain: 0  Food Insecurity: Not on File (09/24/2023)   Received from [facility] Insecurity    Food: 0  Transportation Needs: Not on File (05/06/2022)   Received from [facility], [facility] Needs    Transportation: 0  Physical Activity: Not on File (05/06/2022)   Received from [place], [place]   Physical Activity    Physical Activity: 0  Stress: Not on File (05/06/2022)   Received from [facility], [place]   Stress    Stress: 0  Social Connections: Not on File (09/12/2023)   Received from [facility]   Social Connections    Connectedness: 0  Intimate Partner Violence: Not on file   Family History  Problem Relation Age of Onset   Diabetes Father    Breast cancer Maternal Aunt     Vitals  There were no vitals taken for this visit.   Examination  Gen: Awake, alert and oriented x 3, no acute distress HEENT: [place]/AT, no scleral icterus, no pale conjunctivae, hearing normal, oral mucosa moist Neck: Supple Cardio: Regular rate and rhythm Resp: CTAB GI: nondistended GU: Penile wart in the shaft appears to be smaller than last seen approx  1 to 1.5 cm in length and breadth. No signs of infection or discharge, Exam with chaperone Extremities: No pedal edema  Neuro: grossly non focal  Psych: Calm, cooperative  Lab Results HIV 1 RNA Quant (copies/mL)  Date Value  06/16/2023 NOT DETECTED  11/25/2022 NOT DETECTED   06/24/2022 NOT DETECTED   CD4 T Cell Abs (/uL)  Date Value  06/16/2023 220 (L)  11/25/2022 201 (L)  06/24/2022 172 (L)   No results found for: "HIV1GENOSEQ" Lab Results  Component Value Date   WBC 5.3 06/16/2023   HGB 15.0 06/16/2023   HCT 44.6 06/16/2023   MCV 86.9 06/16/2023   PLT 205 06/16/2023    Lab Results  Component Value Date   CREATININE 1.20 06/16/2023   BUN 17 06/16/2023   NA 140 06/16/2023   K 4.1 06/16/2023   CL 107 06/16/2023   CO2 30 06/16/2023   Lab Results  Component Value Date   ALT 16 06/16/2023   AST 17 06/16/2023   BILITOT 0.3 06/16/2023    Lab Results  Component Value Date   CHOL 176 06/16/2023   TRIG 57 06/16/2023   HDL 49 06/16/2023   LDLCALC 113 (H) 06/16/2023   Lab Results  Component Value Date   HAV NON-REACTIVE 05/20/2021   Lab Results  Component Value Date   HEPBSAG REACTIVE (A) 06/16/2023   HEPBSAB NON-REACTIVE 11/25/2022   No results found for: "HCVAB" Lab Results  Component Value Date   CHLAMYDIAWP Negative 06/16/2023   N Negative 06/16/2023   No results found for: "GCPROBEAPT" No results  found for: "QUANTGOLD"   Health Maintenance: Immunization History  Administered Date(s) Administered   HPV 9-valent 10/17/2020, 11/19/2020, 04/22/2021   Hepatitis A, Adult 07/06/2020, 04/22/2021   Influenza,inj,Quad PF,6+ Mos 10/13/2019, 02/11/2021, 01/22/2022, 11/25/2022   MenQuadfi_Meningococcal Groups ACYW Conjugate 02/11/2021   Meningococcal Conjugate 10/17/2020   PFIZER(Purple Top)SARS-COV-2 Vaccination 05/22/2020, 06/12/2020, 12/17/2020   PNEUMOCOCCAL CONJUGATE-20 01/22/2022   Pfizer(Comirnaty)Fall Seasonal Vaccine 12 years and older 11/25/2022   Pneumococcal Conjugate-13 12/26/2019   Tdap 07/06/2020    Assessment/Plan: # HIV,/AIDs, well controlled  - Continue Biktarvy  - Labs today  - Fu in 6 months    # STD screening - Urine GC and RPR - No acute concerns   # Penile wart  - continue aldara cream  - New  referral to dermatology   # Chronic Hepatitis B  - Continue Biktarvy  - Labs for Hep B  #  H/o Cardiomyoapthy with use of life vest in the past/DM/Hx pf stroke/hyperlipidemia  - Started back on aspirin, atorvastatin  - Following with PCP   # Immunization  Hepatitis A 07/06/2020, 04/02/2021 HPV 9 07/06/2020, 04/02/2021 COVID - 05/12/2020, 06/12/2020, 12/17/20, 11/25/2022, not due for covid vaccine booster  ( less than 65 years, HIV well controlled wirh CD4 last more than 200) Influenza - 11/25/22 Monkeypox - due Pneumococcal - PCV 13, 12/26/19 PCV 20 01/22/22 Meningococcal - 10,20/2021, 02/11/2021 HepA - has received 2 doses in 2021 and 2022  Tdap - 2021  Shingles   # Health Maintenance -he wants to do anal pap later  Patient's labs were reviewed as well as his previous records. Patients questions were addressed and answered. Safe sex counseling done.  I have personally spent 40 minutes involved in face-to-face and non-face-to-face activities for this patient on the day of the visit.   Electronically signed by:  Odette Fraction, MD Infectious Disease Physician [facility] [address] [place], [place] [address] Phone: [phone]  Fax: [phone]

## 2024-02-10 LAB — T-HELPER CELLS (CD4) COUNT (NOT AT ARMC)
CD4 % Helper T Cell: 14 % — ABNORMAL LOW (ref 33–65)
CD4 T Cell Abs: 271 /uL — ABNORMAL LOW (ref 400–1790)

## 2024-02-10 LAB — URINE CYTOLOGY ANCILLARY ONLY
Chlamydia: NEGATIVE
Comment: NEGATIVE
Comment: NORMAL
Neisseria Gonorrhea: NEGATIVE

## 2024-02-11 DIAGNOSIS — Z23 Encounter for immunization: Secondary | ICD-10-CM | POA: Insufficient documentation

## 2024-02-12 LAB — COMPREHENSIVE METABOLIC PANEL
AG Ratio: 1.6 (calc) (ref 1.0–2.5)
ALT: 19 U/L (ref 9–46)
AST: 26 U/L (ref 10–40)
Albumin: 4.2 g/dL (ref 3.6–5.1)
Alkaline phosphatase (APISO): 91 U/L (ref 36–130)
BUN: 16 mg/dL (ref 7–25)
CO2: 27 mmol/L (ref 20–32)
Calcium: 9.2 mg/dL (ref 8.6–10.3)
Chloride: 107 mmol/L (ref 98–110)
Creat: 1.25 mg/dL (ref 0.60–1.29)
Globulin: 2.7 g/dL (ref 1.9–3.7)
Glucose, Bld: 99 mg/dL (ref 65–99)
Potassium: 3.9 mmol/L (ref 3.5–5.3)
Sodium: 141 mmol/L (ref 135–146)
Total Bilirubin: 0.6 mg/dL (ref 0.2–1.2)
Total Protein: 6.9 g/dL (ref 6.1–8.1)

## 2024-02-12 LAB — CBC
HCT: 47.5 % (ref 38.5–50.0)
Hemoglobin: 15.9 g/dL (ref 13.2–17.1)
MCH: 30.4 pg (ref 27.0–33.0)
MCHC: 33.5 g/dL (ref 32.0–36.0)
MCV: 90.8 fL (ref 80.0–100.0)
MPV: 11.4 fL (ref 7.5–12.5)
Platelets: 254 10*3/uL (ref 140–400)
RBC: 5.23 10*6/uL (ref 4.20–5.80)
RDW: 12.7 % (ref 11.0–15.0)
WBC: 5.1 10*3/uL (ref 3.8–10.8)

## 2024-02-12 LAB — LIPID PANEL
Cholesterol: 141 mg/dL (ref <200)
HDL: 47 mg/dL (ref >=40)
LDL Cholesterol (Calc): 82 mg/dL
Non-HDL Cholesterol (Calc): 94 mg/dL (ref <130)
Total CHOL/HDL Ratio: 3 (calc) (ref <5.0)
Triglycerides: 42 mg/dL (ref <150)

## 2024-02-12 LAB — HEPATITIS B DNA, ULTRAQUANTITATIVE, PCR
Hepatitis B DNA: <10 [IU]/mL — ABNORMAL HIGH
Hepatitis B virus DNA: <1 {Log} — ABNORMAL HIGH

## 2024-02-12 LAB — HEPATITIS B E ANTIGEN: Hep B E Ag: REACTIVE — AB

## 2024-02-12 LAB — HEPATITIS B E ANTIBODY: Hep B E Ab: NONREACTIVE

## 2024-02-12 LAB — T PALLIDUM AB: T Pallidum Abs: POSITIVE — AB

## 2024-02-12 LAB — HIV RNA, RTPCR W/R GT (RTI, PI,INT)
HIV 1 RNA Quant: 24 {copies}/mL — ABNORMAL HIGH
HIV-1 RNA Quant, Log: 1.38 {Log} — ABNORMAL HIGH

## 2024-02-12 LAB — RPR TITER: RPR Titer: 1:8 {titer} — ABNORMAL HIGH

## 2024-02-12 LAB — RPR: RPR Ser Ql: REACTIVE — AB

## 2024-02-12 LAB — HEPATITIS B SURFACE ANTIGEN: Hepatitis B Surface Ag: REACTIVE — AB

## 2024-02-15 ENCOUNTER — Telehealth: Payer: Self-pay

## 2024-02-15 NOTE — Telephone Encounter (Signed)
-----   Message from Jonathan Gutierrez sent at 02/15/2024  7:44 AM EST ----- Please let patient know lab work is negative for any acute abnormality.   RPR is stable at 1:8, nothing to do.

## 2024-02-16 LAB — CYTOLOGY - PAP

## 2024-02-17 ENCOUNTER — Telehealth: Payer: Self-pay

## 2024-02-17 ENCOUNTER — Other Ambulatory Visit: Payer: Self-pay | Admitting: Infectious Diseases

## 2024-02-17 DIAGNOSIS — R85619 Unspecified abnormal cytological findings in specimens from anus: Secondary | ICD-10-CM

## 2024-02-17 NOTE — Telephone Encounter (Signed)
-----   Message from Victoriano Lain sent at 02/17/2024  8:59 AM EST ----- Please let patient know that anal pap is abnormal and will need further work up. I have placed a referral to colorectal surgery for possible HRA ( high resolution anoscopy)

## 2024-04-05 ENCOUNTER — Ambulatory Visit: Payer: PRIVATE HEALTH INSURANCE | Admitting: Dermatology

## 2024-04-05 ENCOUNTER — Encounter: Payer: Self-pay | Admitting: Dermatology

## 2024-04-05 VITALS — BP 142/67 | HR 64

## 2024-04-05 DIAGNOSIS — A630 Anogenital (venereal) warts: Secondary | ICD-10-CM

## 2024-04-05 DIAGNOSIS — D489 Neoplasm of uncertain behavior, unspecified: Secondary | ICD-10-CM

## 2024-04-05 DIAGNOSIS — D074 Carcinoma in situ of penis: Secondary | ICD-10-CM

## 2024-04-05 DIAGNOSIS — D0761 Carcinoma in situ of scrotum: Secondary | ICD-10-CM | POA: Diagnosis not present

## 2024-04-05 NOTE — Patient Instructions (Addendum)

## 2024-04-05 NOTE — Progress Notes (Signed)
 New Patient Visit   Subjective  Jonathan Gutierrez is a 48 y.o. male who presents for the following:  Patient is here today concerning warts at scrotum, patient states he noticed them over a year ago. Patient reports he was prescribed aldara 5 % cream to use. States did not see any improvement with cream.   48 year old male presents with growths on his scrotum diagnosed as warts. Patient was prescribed Aldara by his primary care physician, which was ineffective, leading to a dermatology referral. The duration of the warts is unclear, but the patient reports a history of warts years ago. The lesions are causing pain when walking. Patient denies recent sexual activity, with last reported intercourse in 2016 prior to a stroke.   Patient has a history of syphilis treated in [place] in 2022, with stable RPR titers as of February 11. He is currently on Biktarvy for HIV management, though recent viral load status is unknown. The patient's immunocompromised status may contribute to HPV reactivation.  Patient reports he is constantly washing hand daily and noticed right hand gets very dry and cracks.    The patient has spots, moles and lesions to be evaluated, some may be new or changing and the patient may have concern these could be cancer.   The following portions of the chart were reviewed this encounter and updated as appropriate: medications, allergies, medical history  Review of Systems:  No other skin or systemic complaints except as noted in HPI or Assessment and Plan.  Objective  Well appearing patient in no apparent distress; mood and affect are within normal limits.    A focused examination was performed of the following areas: Right hand, scrotum, penis  Relevant exam findings are noted in the Assessment and Plan.  Glans of Penis Large cauliflower plaque  scrotum Large cauliflower plaque    Assessment & Plan   1. Genital warts (condyloma acuminata) - Assessment: Large  verrucous plaques on the scrotum and glans penis. Patient reports pain while walking. Previous treatment with Aldara (imiquimod) cream was ineffective. Concern for HPV reactivation and potential for more aggressive forms due to HIV-positive status and immunocompromise.  - Plan:    Perform biopsy of two areas: one on the penis and one on the scrotum    Apply Aquaphor to biopsy sites twice daily    Follow-up appointment in 3 weeks for biopsy results and treatment    If biopsy confirms warts: plan to freeze lesions and prescribe a different topical medication (Wart Pen from [facility] compounding pharmacy)  NEOPLASM OF UNCERTAIN BEHAVIOR (2) Glans of Penis Skin / nail biopsy Type of biopsy: tangential   Informed consent: discussed and consent obtained   Patient was prepped and draped in usual sterile fashion: Area prepped with alcohol. Anesthesia: the lesion was anesthetized in a standard fashion   Anesthetic:  1% lidocaine w/ epinephrine 1-100,000 buffered w/ 8.4% NaHCO3 Instrument used: flexible razor blade   Hemostasis achieved with: pressure, aluminum chloride and electrodesiccation   Outcome: patient tolerated procedure well   Post-procedure details: wound care instructions given   Post-procedure details comment:  Ointment and small bandage applied Specimen 1 - Surgical pathology Differential Diagnosis: R/o condyloma vs other hpv typing for viral specificity  Check Margins: No scrotum Skin / nail biopsy Type of biopsy: tangential   Informed consent: discussed and consent obtained   Patient was prepped and draped in usual sterile fashion: Area prepped with alcohol. Anesthesia: the lesion was anesthetized in a standard fashion  Anesthetic:  1% lidocaine w/ epinephrine 1-100,000 buffered w/ 8.4% NaHCO3 Instrument used: flexible razor blade   Hemostasis achieved with: pressure, aluminum chloride and electrodesiccation   Outcome: patient tolerated procedure well   Post-procedure  details: wound care instructions given   Post-procedure details comment:  Ointment and small bandage applied Specimen 2 - Surgical pathology Differential Diagnosis: R/o condyloma vs other hpv typing for viral specificity  Check Margins: No R/o condyloma vs other  Pending results will order hpv typing for viral specificity   Return for 3 weeks follow up on possible warts .  I, Asher Muir, CMA, am acting as scribe for [employer], DO.   Documentation: I have reviewed the above documentation for accuracy and completeness, and I agree with the above.  Langston Reusing, DO

## 2024-04-07 LAB — SURGICAL PATHOLOGY

## 2024-04-11 ENCOUNTER — Other Ambulatory Visit: Payer: Self-pay

## 2024-04-11 DIAGNOSIS — R85619 Unspecified abnormal cytological findings in specimens from anus: Secondary | ICD-10-CM

## 2024-04-28 DIAGNOSIS — D099 Carcinoma in situ, unspecified: Secondary | ICD-10-CM

## 2024-04-28 HISTORY — DX: Carcinoma in situ, unspecified: D09.9

## 2024-05-05 ENCOUNTER — Ambulatory Visit: Payer: PRIVATE HEALTH INSURANCE | Admitting: Dermatology

## 2024-05-05 ENCOUNTER — Telehealth: Payer: Self-pay

## 2024-05-05 NOTE — Telephone Encounter (Signed)
-----   Message from Louana Roup sent at 04/28/2024 12:15 PM EDT ----- Hi Surafel Hilleary,  I called pt but could not get in touch with him.  Please call patient again this afternoon and let him know that his skin biopsy results were postive for skin cancer in both locations.  Our Mohs surgeon Dr Pasi would like to schedule a consultation with him to discuss surgical options.  Once you speak with him, please forward his information to the front desk to get him scheduled for a consult (not a mohs surgery, he will need a separate consult first)  Thanks!  -Dr Myrtie Atkinson  FINAL DIAGNOSIS and MICROSCOPIC DESCRIPTION Diagnosis 1. 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 Skin , scrotum SQUAMOUS CELL CARCINOMA IN SITU, ASSOCIATED WITH VERRUCA

## 2024-05-05 NOTE — Telephone Encounter (Signed)
 Pt r/s today's. Letter sent.

## 2024-05-26 ENCOUNTER — Ambulatory Visit: Payer: Self-pay

## 2024-05-27 NOTE — Progress Notes (Signed)
 The ASCVD Risk score (Arnett DK, et al., 2019) failed to calculate for the following reasons:   Risk score cannot be calculated because patient has a medical history suggesting prior/existing ASCVD  Arlon Bergamo, BSN, RN

## 2024-06-02 ENCOUNTER — Ambulatory Visit: Payer: PRIVATE HEALTH INSURANCE | Admitting: Dermatology

## 2024-07-13 ENCOUNTER — Other Ambulatory Visit: Payer: Self-pay | Admitting: Infectious Diseases

## 2024-07-13 DIAGNOSIS — B20 Human immunodeficiency virus [HIV] disease: Secondary | ICD-10-CM

## 2024-08-02 ENCOUNTER — Ambulatory Visit: Payer: PRIVATE HEALTH INSURANCE | Admitting: Infectious Diseases

## 2024-08-15 ENCOUNTER — Other Ambulatory Visit: Payer: Self-pay | Admitting: Infectious Diseases

## 2024-08-15 DIAGNOSIS — B20 Human immunodeficiency virus [HIV] disease: Secondary | ICD-10-CM

## 2024-08-17 ENCOUNTER — Telehealth: Payer: Self-pay

## 2024-08-17 NOTE — Telephone Encounter (Signed)
 Patient left message to schedule an appt to renew financial application. I returned his call and left a message. Patient also needs to schedule a doctors appointment too.

## 2024-09-06 ENCOUNTER — Ambulatory Visit: Payer: PRIVATE HEALTH INSURANCE

## 2024-09-12 ENCOUNTER — Ambulatory Visit: Payer: PRIVATE HEALTH INSURANCE

## 2024-09-12 ENCOUNTER — Other Ambulatory Visit (HOSPITAL_COMMUNITY): Payer: Self-pay

## 2024-09-12 ENCOUNTER — Other Ambulatory Visit: Payer: Self-pay

## 2024-09-19 ENCOUNTER — Other Ambulatory Visit: Payer: Self-pay | Admitting: Infectious Diseases

## 2024-09-19 DIAGNOSIS — B20 Human immunodeficiency virus [HIV] disease: Secondary | ICD-10-CM

## 2024-09-20 ENCOUNTER — Encounter: Payer: Self-pay | Admitting: Infectious Diseases

## 2024-09-20 ENCOUNTER — Ambulatory Visit (INDEPENDENT_AMBULATORY_CARE_PROVIDER_SITE_OTHER): Payer: Self-pay | Admitting: Infectious Diseases

## 2024-09-20 ENCOUNTER — Other Ambulatory Visit: Payer: Self-pay

## 2024-09-20 VITALS — BP 131/78 | HR 65 | Temp 98.5°F | Resp 16 | Wt 158.0 lb

## 2024-09-20 DIAGNOSIS — B181 Chronic viral hepatitis B without delta-agent: Secondary | ICD-10-CM

## 2024-09-20 DIAGNOSIS — Z23 Encounter for immunization: Secondary | ICD-10-CM

## 2024-09-20 DIAGNOSIS — B20 Human immunodeficiency virus [HIV] disease: Secondary | ICD-10-CM

## 2024-09-20 DIAGNOSIS — D099 Carcinoma in situ, unspecified: Secondary | ICD-10-CM

## 2024-09-20 DIAGNOSIS — Z0000 Encounter for general adult medical examination without abnormal findings: Secondary | ICD-10-CM

## 2024-09-20 DIAGNOSIS — R85619 Unspecified abnormal cytological findings in specimens from anus: Secondary | ICD-10-CM

## 2024-09-20 DIAGNOSIS — Z113 Encounter for screening for infections with a predominantly sexual mode of transmission: Secondary | ICD-10-CM

## 2024-09-20 DIAGNOSIS — Z5181 Encounter for therapeutic drug level monitoring: Secondary | ICD-10-CM

## 2024-09-20 MED ORDER — BIKTARVY 50-200-25 MG PO TABS: 1.0000 | ORAL_TABLET | Freq: Every day | ORAL | 11 refills | Status: AC

## 2024-09-20 NOTE — Progress Notes (Unsigned)
 [address], [place], [place], [address]                                                                  Phn. [phone]; Fax: [phone]                                                                             Date: 09/20/24 Reason for Visit: Regular HIV fu   HPI: Jonathan Gutierrez is a 48 y.o.old male with a history of HIV. Last seen 02/09/24.   Interval H/o Compliant with Biktarvy  with no missed doses or concerns. Seen by Dermatology on 04/05/24 where biopsy was done. Path consistent with malignancy.   He reports switching job and figuring out hisinsurance. Discussed about abnormal pap.  Willing for STD screen and flu vaccine. No concerns today  ROS: As stated in above HPI; all other systems were reviewed and are otherwise negative unless noted below  No reported fever / chills, night sweats, unintentional weight loss, acute visual change, odynophagia, chest pain/pressure, new or worsened SOB or WOB, nausea, vomiting, diarrhea, dysuria, GU discharge, syncope, seizures, red/hot swollen joints, hallucinations / delusions, rashes, new allergies, unusual / excessive bleeding, swollen lymph nodes.  PMH/ PSH/ FamHx / Social Hx , medications and allergies reviewed and updated as appropriate; please see corresponding tab in EHR / prior notes  Current Outpatient Medications on File Prior to Visit  Medication Sig Dispense Refill   acetaminophen  (TYLENOL ) 325 MG tablet Take 2 tablets (650 mg total) by mouth every 6 (six) hours as needed for moderate pain (pain score 4-6). 30 tablet 0   aspirin  EC (ASPIRIN  81) 81 MG tablet Take 1 tablet (81 mg total) by mouth daily. Swallow whole. 30 tablet 0   atorvastatin  (LIPITOR) 40 MG tablet Take 1 tablet (40 mg total) by mouth daily. 90 tablet 1   BIKTARVY   50-200-25 MG TABS tablet TAKE 1 TABLET BY MOUTH DAILY 30 tablet 0   cyclobenzaprine  (FLEXERIL ) 5 MG tablet Take 1 tablet (5 mg total) by mouth at bedtime as needed. 30 tablet 0   imiquimod  (ALDARA ) 5 % cream Apply topically 3 (three) times a week. Apply once daily at bedtime, 3 times/week on non consecutive days for up to 16 weeks. Treatment area should be washed with soap and water 6-10 hrs after application 12 each 5   No current facility-administered medications on file prior to visit.   Allergies  Allergen Reactions   Bactrim [Sulfamethoxazole-Trimethoprim]    Social History   Socioeconomic History   Marital status: Single    Spouse name: Not on file   Number of children: Not  on file   Years of education: Not on file   Highest education level: Not on file  Occupational History   Not on file  Tobacco Use   Smoking status: Former    Current packs/day: 0.00    Types: Cigarettes, Cigars    Quit date: 05/30/2019    Years since quitting: 5.3   Smokeless tobacco: Never   Tobacco comments:    Trying to quit, smoking 3 black and milds a day   Vaping Use   Vaping status: Every Day  Substance and Sexual Activity   Alcohol use: Not Currently   Drug use: Yes    Types: Marijuana    Comment: daily   Sexual activity: Not Currently    Partners: Male, Male    Comment: declined condoms 07/2021  Other Topics Concern   Not on file  Social History Narrative   Lives with his aunt   Social Drivers of Health   Financial Resource Strain: Not on File (05/06/2022)   Received from [employer]    Financial Resource Strain: 0  Food Insecurity: Not on File (09/24/2023)   Received from [facility] Insecurity    Food: 0  Transportation Needs: Not on File (05/06/2022)   Received from [facility] Needs    Transportation: 0  Physical Activity: Not on File (05/06/2022)   Received from [facility]   Physical Activity    Physical Activity: 0  Stress: Not on File  (05/06/2022)   Received from [facility]   Stress    Stress: 0  Social Connections: Not on File (09/12/2023)   Received from [facility]   Social Connections    Connectedness: 0  Intimate Partner Violence: Not on file   Family History  Problem Relation Age of Onset   Diabetes Father    Breast cancer Maternal Aunt    Vitals  BP 131/78   Pulse 65   Temp 98.5 F (36.9 C) (Oral)   Resp 16   Wt 158 lb (71.7 kg)   SpO2 98%   BMI 22.04 kg/m    Examination  Gen: Awake, alert and oriented x 3, no acute distress HEENT: [place]/AT, no scleral icterus, no pale conjunctivae, hearing normal, oral mucosa moist Neck: Supple Cardio: Regular rate and rhythm Resp: Normal respiratory effort GI: nondistended GU: deferred  Extremities: No pedal edema  Neuro: grossly non focal  Psych: Calm, cooperative  Lab Results HIV 1 RNA Quant (copies/mL)  Date Value  02/09/2024 24 (H)  06/16/2023 NOT DETECTED  11/25/2022 NOT DETECTED   CD4 T Cell Abs (/uL)  Date Value  02/09/2024 271 (L)  06/16/2023 220 (L)  11/25/2022 201 (L)   No results found for: HIV1GENOSEQ Lab Results  Component Value Date   WBC 5.1 02/09/2024   HGB 15.9 02/09/2024   HCT 47.5 02/09/2024   MCV 90.8 02/09/2024   PLT 254 02/09/2024    Lab Results  Component Value Date   CREATININE 1.25 02/09/2024   BUN 16 02/09/2024   NA 141 02/09/2024   K 3.9 02/09/2024   CL 107 02/09/2024   CO2 27 02/09/2024   Lab Results  Component Value Date   ALT 19 02/09/2024   AST 26 02/09/2024   BILITOT 0.6 02/09/2024    Lab Results  Component Value Date   CHOL 141 02/09/2024   TRIG 42 02/09/2024   HDL 47 02/09/2024   LDLCALC 82 02/09/2024   Lab Results  Component Value Date  HAV NON-REACTIVE 05/20/2021   Lab Results  Component Value Date   HEPBSAG REACTIVE (A) 02/09/2024   HEPBSAB NON-REACTIVE 11/25/2022   No results found for: HCVAB Lab Results  Component Value Date   CHLAMYDIAWP Negative 02/09/2024   N Negative 02/09/2024    No results found for: GCPROBEAPT No results found for: QUANTGOLD   Pathology  04/05/24 FINAL DIAGNOSIS        1. Skin, glans of penis :       SQUAMOUS CELL CARCINOMA IN SITU        2. Skin, scrotum :       SQUAMOUS CELL CARCINOMA IN SITU, ASSOCIATED WITH VERRUCA  Health Maintenance: Immunization History  Administered Date(s) Administered   HPV 9-valent 10/17/2020, 11/19/2020, 04/22/2021   Hepatitis A, Adult 07/06/2020, 04/22/2021   Influenza, Mdck, Trivalent,PF 6+ MOS(egg free) 02/09/2024   Influenza,inj,Quad PF,6+ Mos 10/13/2019, 02/11/2021, 01/22/2022, 11/25/2022   MenQuadfi_Meningococcal Groups ACYW Conjugate 02/11/2021   Meningococcal Conjugate 10/17/2020   PFIZER(Purple Top)SARS-COV-2 Vaccination 05/22/2020, 06/12/2020, 12/17/2020   PNEUMOCOCCAL CONJUGATE-20 01/22/2022   Pfizer(Comirnaty)Fall Seasonal Vaccine 12 years and older 11/25/2022, 02/09/2024   Pneumococcal Conjugate-13 12/26/2019   Tdap 07/06/2020    Assessment/Plan: # HIV  - Continue Biktarvy , sent refills.  - Labs today  - Fu in 5-6 months    # STD screening/H/o syphilis  - Urine GC and RPR - No acute concerns   # Penile/Scrotal SCC - fu with Dermatology for further management   # Chronic Hepatitis B  - Continue Biktarvy    #  H/o Cardiomyoapthy with use of life vest in the past/DM/Hx pf stroke/hyperlipidemia  - on aspirin , atorvastatin   - Following with PCP   # Immunization  Hepatitis A 07/06/2020, 04/02/2021 HPV 9 07/06/2020, 04/02/2021 COVID - 05/12/2020, 06/12/2020, 12/17/20, 11/25/2022, 02/09/24 Influenza - 11/25/22, 02/09/24, today Monkeypox - due Pneumococcal - PCV 13, 12/26/19 PCV 20 01/22/22 Meningococcal - 10,20/2021, 02/11/2021 HepA - has received 2 doses in 2021 and 2022  Tdap - 2021  Shingles @ next visit   # Health Maintenance - 02/09/24 Anal pap LSIL, Referred to colorectal surgery again  - Colon ca screening: cologuard 07/09/23 negative   Patient's labs were reviewed as well as his  previous records. Patients questions were addressed and answered. Safe sex counseling done.  I spent 30  minutes involved in face-to-face and non-face-to-face activities for this patient on the day of the visit including review of prior notes, ordering medication/labs and vaccine, referring to other providers, documentation of notes in chart, counseling of the patient and coordination of care.   Electronically signed by:  Annalee Orem, MD Infectious Disease Physician [facility] [address] [place], [place] [address] Phone: [phone]  Fax: [phone]

## 2024-09-21 DIAGNOSIS — R85619 Unspecified abnormal cytological findings in specimens from anus: Secondary | ICD-10-CM | POA: Insufficient documentation

## 2024-09-21 LAB — T-HELPER CELLS (CD4) COUNT (NOT AT ARMC)
CD4 % Helper T Cell: 14 % — ABNORMAL LOW (ref 33–65)
CD4 T Cell Abs: 264 /uL — ABNORMAL LOW (ref 400–1790)

## 2024-09-21 LAB — URINE CYTOLOGY ANCILLARY ONLY
Chlamydia: NEGATIVE
Comment: NEGATIVE
Comment: NORMAL
Neisseria Gonorrhea: NEGATIVE

## 2024-09-24 LAB — COMPREHENSIVE METABOLIC PANEL WITH GFR
AG Ratio: 1.6 (calc) (ref 1.0–2.5)
ALT: 16 U/L (ref 9–46)
AST: 17 U/L (ref 10–40)
Albumin: 3.9 g/dL (ref 3.6–5.1)
Alkaline phosphatase (APISO): 85 U/L (ref 36–130)
BUN: 15 mg/dL (ref 7–25)
CO2: 27 mmol/L (ref 20–32)
Calcium: 9.2 mg/dL (ref 8.6–10.3)
Chloride: 109 mmol/L (ref 98–110)
Creat: 1.12 mg/dL (ref 0.60–1.29)
Globulin: 2.4 g/dL (ref 1.9–3.7)
Glucose, Bld: 86 mg/dL (ref 65–99)
Potassium: 4.3 mmol/L (ref 3.5–5.3)
Sodium: 140 mmol/L (ref 135–146)
Total Bilirubin: 0.4 mg/dL (ref 0.2–1.2)
Total Protein: 6.3 g/dL (ref 6.1–8.1)
eGFR: 82 mL/min/1.73m2 (ref >=60)

## 2024-09-24 LAB — HIV RNA, RTPCR W/R GT (RTI, PI,INT)
HIV 1 RNA Quant: NOT DETECTED {copies}/mL
HIV-1 RNA Quant, Log: NOT DETECTED {Log_copies}/mL

## 2024-09-24 LAB — T PALLIDUM AB: T Pallidum Abs: POSITIVE — AB

## 2024-09-24 LAB — RPR: RPR Ser Ql: REACTIVE — AB

## 2024-09-24 LAB — RPR TITER: RPR Titer: 1:8 {titer} — ABNORMAL HIGH

## 2024-09-28 ENCOUNTER — Ambulatory Visit: Payer: Self-pay | Admitting: Infectious Diseases

## 2025-01-10 ENCOUNTER — Other Ambulatory Visit (HOSPITAL_COMMUNITY): Payer: Self-pay

## 2025-03-07 ENCOUNTER — Ambulatory Visit: Payer: PRIVATE HEALTH INSURANCE | Admitting: Infectious Diseases
